# Patient Record
Sex: Male | Born: 1975 | Race: Black or African American | Hispanic: No | Marital: Single | Smoking: Current every day smoker
Health system: Southern US, Community
[De-identification: ages and names within clinical notes are randomized; demographics above are authoritative.]

## PROBLEM LIST (undated history)

## (undated) DIAGNOSIS — S82209A Unspecified fracture of shaft of unspecified tibia, initial encounter for closed fracture: Secondary | ICD-10-CM

## (undated) DIAGNOSIS — S82409A Unspecified fracture of shaft of unspecified fibula, initial encounter for closed fracture: Secondary | ICD-10-CM

## (undated) DIAGNOSIS — IMO0002 Reserved for concepts with insufficient information to code with codable children: Secondary | ICD-10-CM

## (undated) HISTORY — DX: Unspecified fracture of shaft of unspecified fibula, initial encounter for closed fracture: S82.409A

## (undated) HISTORY — PX: PERCUTANEOUS FIXATION TIBIAL SHAFT FRACTURE W/ PINS / SCREWS: SUR1009

## (undated) HISTORY — DX: Unspecified fracture of shaft of unspecified tibia, initial encounter for closed fracture: S82.209A

---

## 2011-06-28 ENCOUNTER — Inpatient Hospital Stay (INDEPENDENT_AMBULATORY_CARE_PROVIDER_SITE_OTHER)
Admission: RE | Admit: 2011-06-28 | Discharge: 2011-06-28 | Disposition: A | Discharge status: Home / Self Care | Payer: Self-pay | Source: Ambulatory Visit | Attending: Emergency Medicine | Admitting: Emergency Medicine

## 2011-06-28 ENCOUNTER — Emergency Department (HOSPITAL_COMMUNITY)
Admission: EM | Admit: 2011-06-28 | Discharge: 2011-06-28 | Disposition: A | Discharge status: Home / Self Care | Payer: Self-pay | Attending: Emergency Medicine | Admitting: Emergency Medicine

## 2011-06-28 ENCOUNTER — Emergency Department (HOSPITAL_COMMUNITY): Payer: Self-pay

## 2011-06-28 DIAGNOSIS — F1010 Alcohol abuse, uncomplicated: Secondary | ICD-10-CM | POA: Insufficient documentation

## 2011-06-28 DIAGNOSIS — S060X9A Concussion with loss of consciousness of unspecified duration, initial encounter: Secondary | ICD-10-CM

## 2011-06-28 DIAGNOSIS — S0100XA Unspecified open wound of scalp, initial encounter: Secondary | ICD-10-CM

## 2011-06-28 DIAGNOSIS — R4182 Altered mental status, unspecified: Secondary | ICD-10-CM | POA: Insufficient documentation

## 2011-06-28 DIAGNOSIS — S0990XA Unspecified injury of head, initial encounter: Secondary | ICD-10-CM

## 2011-06-28 DIAGNOSIS — W1789XA Other fall from one level to another, initial encounter: Secondary | ICD-10-CM | POA: Insufficient documentation

## 2011-06-28 DIAGNOSIS — S06330A Contusion and laceration of cerebrum, unspecified, without loss of consciousness, initial encounter: Secondary | ICD-10-CM | POA: Insufficient documentation

## 2011-06-28 LAB — CBC
HCT: 37.5 % — ABNORMAL LOW (ref 39.0–52.0)
Hemoglobin: 12.8 g/dL — ABNORMAL LOW (ref 13.0–17.0)
MCHC: 34.1 g/dL (ref 30.0–36.0)
RBC: 4.19 MIL/uL — ABNORMAL LOW (ref 4.22–5.81)
WBC: 12.6 10*3/uL — ABNORMAL HIGH (ref 4.0–10.5)

## 2011-06-28 LAB — TYPE AND SCREEN: Antibody Screen: NEGATIVE

## 2011-06-28 LAB — COMPREHENSIVE METABOLIC PANEL
ALT: 13 U/L (ref 0–53)
AST: 39 U/L — ABNORMAL HIGH (ref 0–37)
Albumin: 4 g/dL (ref 3.5–5.2)
Alkaline Phosphatase: 45 U/L (ref 39–117)
Chloride: 105 mEq/L (ref 96–112)
Potassium: 3.8 mEq/L (ref 3.5–5.1)
Sodium: 144 mEq/L (ref 135–145)
Total Bilirubin: 0.4 mg/dL (ref 0.3–1.2)
Total Protein: 8.1 g/dL (ref 6.0–8.3)

## 2011-06-28 LAB — DIFFERENTIAL
Basophils Absolute: 0 10*3/uL (ref 0.0–0.1)
Lymphocytes Relative: 19 % (ref 12–46)
Monocytes Absolute: 0.9 10*3/uL (ref 0.1–1.0)
Neutro Abs: 9.3 10*3/uL — ABNORMAL HIGH (ref 1.7–7.7)
Neutrophils Relative %: 74 % (ref 43–77)

## 2011-06-28 LAB — PROTIME-INR: INR: 1.03 (ref 0.00–1.49)

## 2012-06-14 ENCOUNTER — Encounter (HOSPITAL_COMMUNITY): Payer: Self-pay

## 2012-06-14 ENCOUNTER — Emergency Department (HOSPITAL_COMMUNITY)
Admission: EM | Admit: 2012-06-14 | Discharge: 2012-06-14 | Disposition: A | Discharge status: Home / Self Care | Payer: Self-pay | Attending: Emergency Medicine | Admitting: Emergency Medicine

## 2012-06-14 ENCOUNTER — Ambulatory Visit (HOSPITAL_COMMUNITY)
Admission: RE | Admit: 2012-06-14 | Discharge: 2012-06-14 | Disposition: A | Discharge status: Home / Self Care | Payer: Self-pay | Source: Ambulatory Visit | Attending: Emergency Medicine | Admitting: Emergency Medicine

## 2012-06-14 ENCOUNTER — Other Ambulatory Visit (HOSPITAL_COMMUNITY): Payer: Self-pay | Admitting: Emergency Medicine

## 2012-06-14 DIAGNOSIS — J029 Acute pharyngitis, unspecified: Secondary | ICD-10-CM | POA: Insufficient documentation

## 2012-06-14 DIAGNOSIS — M542 Cervicalgia: Secondary | ICD-10-CM | POA: Insufficient documentation

## 2012-06-14 DIAGNOSIS — R509 Fever, unspecified: Secondary | ICD-10-CM | POA: Insufficient documentation

## 2012-06-14 DIAGNOSIS — R220 Localized swelling, mass and lump, head: Secondary | ICD-10-CM | POA: Insufficient documentation

## 2012-06-14 DIAGNOSIS — J039 Acute tonsillitis, unspecified: Secondary | ICD-10-CM | POA: Insufficient documentation

## 2012-06-14 DIAGNOSIS — R070 Pain in throat: Secondary | ICD-10-CM | POA: Insufficient documentation

## 2012-06-14 DIAGNOSIS — R599 Enlarged lymph nodes, unspecified: Secondary | ICD-10-CM | POA: Insufficient documentation

## 2012-06-14 DIAGNOSIS — R1310 Dysphagia, unspecified: Secondary | ICD-10-CM | POA: Insufficient documentation

## 2012-06-14 DIAGNOSIS — R221 Localized swelling, mass and lump, neck: Secondary | ICD-10-CM | POA: Insufficient documentation

## 2012-06-14 MED ORDER — ACETAMINOPHEN 160 MG/5ML PO SOLN
ORAL | Status: AC
  Filled 2012-06-14: qty 40.6

## 2012-06-14 MED ORDER — MORPHINE SULFATE 4 MG/ML IJ SOLN
INTRAMUSCULAR | Status: AC
  Filled 2012-06-14: qty 1

## 2012-06-14 MED ORDER — IOHEXOL 300 MG/ML  SOLN
100.0000 mL | Freq: Once | INTRAMUSCULAR | Status: AC | PRN
  Administered 2012-06-14: 100 mL via INTRAVENOUS

## 2012-06-14 NOTE — ED Notes (Signed)
See downtime charting. 

## 2013-08-16 ENCOUNTER — Encounter (HOSPITAL_COMMUNITY): Payer: Self-pay | Admitting: Nurse Practitioner

## 2013-08-16 ENCOUNTER — Emergency Department (HOSPITAL_COMMUNITY)
Admission: EM | Admit: 2013-08-16 | Discharge: 2013-08-16 | Discharge status: Against Medical Advice | Payer: Self-pay | Attending: Emergency Medicine | Admitting: Emergency Medicine

## 2013-08-16 DIAGNOSIS — F17200 Nicotine dependence, unspecified, uncomplicated: Secondary | ICD-10-CM | POA: Insufficient documentation

## 2013-08-16 DIAGNOSIS — R296 Repeated falls: Secondary | ICD-10-CM | POA: Insufficient documentation

## 2013-08-16 DIAGNOSIS — IMO0002 Reserved for concepts with insufficient information to code with codable children: Secondary | ICD-10-CM | POA: Insufficient documentation

## 2013-08-16 DIAGNOSIS — S298XXA Other specified injuries of thorax, initial encounter: Secondary | ICD-10-CM | POA: Insufficient documentation

## 2013-08-16 DIAGNOSIS — R780 Finding of alcohol in blood: Secondary | ICD-10-CM

## 2013-08-16 DIAGNOSIS — R4789 Other speech disturbances: Secondary | ICD-10-CM | POA: Insufficient documentation

## 2013-08-16 DIAGNOSIS — Y929 Unspecified place or not applicable: Secondary | ICD-10-CM | POA: Insufficient documentation

## 2013-08-16 DIAGNOSIS — Y9389 Activity, other specified: Secondary | ICD-10-CM | POA: Insufficient documentation

## 2013-08-16 DIAGNOSIS — R079 Chest pain, unspecified: Secondary | ICD-10-CM

## 2013-08-16 DIAGNOSIS — F1010 Alcohol abuse, uncomplicated: Secondary | ICD-10-CM | POA: Insufficient documentation

## 2013-08-16 DIAGNOSIS — Z880 Allergy status to penicillin: Secondary | ICD-10-CM | POA: Insufficient documentation

## 2013-08-16 HISTORY — DX: Reserved for concepts with insufficient information to code with codable children: IMO0002

## 2013-08-16 LAB — URINALYSIS, ROUTINE W REFLEX MICROSCOPIC
Bilirubin Urine: NEGATIVE
Glucose, UA: NEGATIVE mg/dL
Hgb urine dipstick: NEGATIVE
Protein, ur: NEGATIVE mg/dL
Urobilinogen, UA: 0.2 mg/dL (ref 0.0–1.0)

## 2013-08-16 LAB — RAPID URINE DRUG SCREEN, HOSP PERFORMED
Amphetamines: NOT DETECTED
Cocaine: NOT DETECTED
Opiates: NOT DETECTED
Tetrahydrocannabinol: NOT DETECTED

## 2013-08-16 LAB — CBC WITH DIFFERENTIAL/PLATELET
Basophils Absolute: 0 10*3/uL (ref 0.0–0.1)
Basophils Relative: 0 % (ref 0–1)
Eosinophils Absolute: 0.1 10*3/uL (ref 0.0–0.7)
Hemoglobin: 14 g/dL (ref 13.0–17.0)
MCH: 30.8 pg (ref 26.0–34.0)
MCHC: 34.7 g/dL (ref 30.0–36.0)
Neutro Abs: 5.8 10*3/uL (ref 1.7–7.7)
Neutrophils Relative %: 64 % (ref 43–77)
Platelets: 260 10*3/uL (ref 150–400)

## 2013-08-16 LAB — COMPREHENSIVE METABOLIC PANEL
ALT: 30 U/L (ref 0–53)
AST: 57 U/L — ABNORMAL HIGH (ref 0–37)
CO2: 24 mEq/L (ref 19–32)
Calcium: 9.7 mg/dL (ref 8.4–10.5)
Chloride: 102 mEq/L (ref 96–112)
Creatinine, Ser: 0.95 mg/dL (ref 0.50–1.35)
GFR calc Af Amer: >90 mL/min (ref >90)
GFR calc non Af Amer: >90 mL/min (ref >90)
Glucose, Bld: 85 mg/dL (ref 70–99)
Sodium: 142 mEq/L (ref 135–145)
Total Bilirubin: 0.3 mg/dL (ref 0.3–1.2)

## 2013-08-16 LAB — ETHANOL: Alcohol, Ethyl (B): 260 mg/dL — ABNORMAL HIGH (ref 0–11)

## 2013-08-16 LAB — POCT I-STAT TROPONIN I: Troponin i, poc: 0 ng/mL (ref 0.00–0.08)

## 2013-08-16 MED ORDER — LORAZEPAM 2 MG/ML IJ SOLN: 1.0000 mg | Freq: Once | INTRAMUSCULAR | Status: DC
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
## 2013-08-16 NOTE — ED Notes (Signed)
Pt refusing blood draw and/or IV.

## 2013-08-16 NOTE — ED Notes (Signed)
Entered patient's room and he was pulling off all of his wires and getting dressed. When I asked him what he was doing he stated he was leaving. Notified physician and tending RN. Patient signed AMA and [employer] and I escorted patient out front where he was picked up by someone. Patient was stable and sustained no injury upon departure.
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
## 2013-08-18 NOTE — ED Provider Notes (Signed)
Medical screening examination/treatment/procedure(s) were performed by non-physician practitioner and as supervising physician I was immediately available for consultation/collaboration.  Jasyn Mey R. Shakiyla Kook, MD 08/18/13 1613 

## 2015-11-25 ENCOUNTER — Emergency Department (HOSPITAL_COMMUNITY): Payer: Self-pay

## 2015-11-25 ENCOUNTER — Inpatient Hospital Stay (HOSPITAL_COMMUNITY)
Admission: EM | Admit: 2015-11-25 | Discharge: 2015-11-28 | DRG: 493 | Disposition: A | Discharge status: Home / Self Care | Payer: Self-pay | Attending: Orthopedic Surgery | Admitting: Orthopedic Surgery

## 2015-11-25 ENCOUNTER — Emergency Department (HOSPITAL_COMMUNITY): Payer: Self-pay | Admitting: Anesthesiology

## 2015-11-25 ENCOUNTER — Encounter (HOSPITAL_COMMUNITY): Payer: Self-pay | Admitting: Emergency Medicine

## 2015-11-25 ENCOUNTER — Encounter (HOSPITAL_COMMUNITY)
Admission: EM | Disposition: A | Discharge status: Home / Self Care | Payer: Self-pay | Source: Home / Self Care | Attending: Orthopedic Surgery

## 2015-11-25 DIAGNOSIS — S82832A Other fracture of upper and lower end of left fibula, initial encounter for closed fracture: Secondary | ICD-10-CM

## 2015-11-25 DIAGNOSIS — S82202B Unspecified fracture of shaft of left tibia, initial encounter for open fracture type I or II: Principal | ICD-10-CM | POA: Diagnosis present

## 2015-11-25 DIAGNOSIS — T360X5A Adverse effect of penicillins, initial encounter: Secondary | ICD-10-CM | POA: Diagnosis present

## 2015-11-25 DIAGNOSIS — S82409A Unspecified fracture of shaft of unspecified fibula, initial encounter for closed fracture: Secondary | ICD-10-CM

## 2015-11-25 DIAGNOSIS — M85862 Other specified disorders of bone density and structure, left lower leg: Secondary | ICD-10-CM | POA: Diagnosis present

## 2015-11-25 DIAGNOSIS — Z9889 Other specified postprocedural states: Secondary | ICD-10-CM

## 2015-11-25 DIAGNOSIS — F17210 Nicotine dependence, cigarettes, uncomplicated: Secondary | ICD-10-CM | POA: Diagnosis present

## 2015-11-25 DIAGNOSIS — T886XXA Anaphylactic reaction due to adverse effect of correct drug or medicament properly administered, initial encounter: Secondary | ICD-10-CM | POA: Diagnosis present

## 2015-11-25 DIAGNOSIS — Z8781 Personal history of (healed) traumatic fracture: Secondary | ICD-10-CM

## 2015-11-25 DIAGNOSIS — S82209A Unspecified fracture of shaft of unspecified tibia, initial encounter for closed fracture: Secondary | ICD-10-CM | POA: Diagnosis present

## 2015-11-25 DIAGNOSIS — Z880 Allergy status to penicillin: Secondary | ICD-10-CM

## 2015-11-25 DIAGNOSIS — S82402B Unspecified fracture of shaft of left fibula, initial encounter for open fracture type I or II: Secondary | ICD-10-CM | POA: Diagnosis present

## 2015-11-25 HISTORY — PX: TIBIA IM NAIL INSERTION: SHX2516

## 2015-11-25 LAB — CBC WITH DIFFERENTIAL/PLATELET
Basophils Absolute: 0 10*3/uL (ref 0.0–0.1)
Basophils Relative: 0 %
EOS PCT: 1 %
Eosinophils Absolute: 0.1 10*3/uL (ref 0.0–0.7)
HEMATOCRIT: 38.5 % — AB (ref 39.0–52.0)
HEMOGLOBIN: 12.3 g/dL — AB (ref 13.0–17.0)
Lymphocytes Relative: 28 %
Lymphs Abs: 3 10*3/uL (ref 0.7–4.0)
MCH: 29.6 pg (ref 26.0–34.0)
MCHC: 31.9 g/dL (ref 30.0–36.0)
MCV: 92.8 fL (ref 78.0–100.0)
MONO ABS: 0.2 10*3/uL (ref 0.1–1.0)
Monocytes Relative: 1 %
Neutro Abs: 7.4 10*3/uL (ref 1.7–7.7)
Neutrophils Relative %: 70 %
Platelets: 249 10*3/uL (ref 150–400)
RBC: 4.15 MIL/uL — ABNORMAL LOW (ref 4.22–5.81)
RDW: 12.6 % (ref 11.5–15.5)
WBC: 10.6 10*3/uL — AB (ref 4.0–10.5)

## 2015-11-25 LAB — URINALYSIS, ROUTINE W REFLEX MICROSCOPIC
Bilirubin Urine: NEGATIVE
GLUCOSE, UA: NEGATIVE mg/dL
HGB URINE DIPSTICK: NEGATIVE
Ketones, ur: NEGATIVE mg/dL
Leukocytes, UA: NEGATIVE
Nitrite: NEGATIVE
PH: 5 (ref 5.0–8.0)
Protein, ur: NEGATIVE mg/dL
SPECIFIC GRAVITY, URINE: 1.008 (ref 1.005–1.030)

## 2015-11-25 LAB — RAPID URINE DRUG SCREEN, HOSP PERFORMED
AMPHETAMINES: NOT DETECTED
Barbiturates: NOT DETECTED
Benzodiazepines: NOT DETECTED
Cocaine: NOT DETECTED
Opiates: NOT DETECTED
TETRAHYDROCANNABINOL: NOT DETECTED

## 2015-11-25 LAB — ETHANOL: Alcohol, Ethyl (B): 349 mg/dL (ref <5)

## 2015-11-25 SURGERY — INSERTION, INTRAMEDULLARY ROD, TIBIA
Anesthesia: General | Site: Leg Lower | Laterality: Left

## 2015-11-25 MED ORDER — SUCCINYLCHOLINE CHLORIDE 20 MG/ML IJ SOLN
INTRAMUSCULAR | Status: DC | PRN
  Administered 2015-11-25: 120 mg via INTRAVENOUS

## 2015-11-25 MED ORDER — TETANUS-DIPHTH-ACELL PERTUSSIS 5-2.5-18.5 LF-MCG/0.5 IM SUSP: 0.5000 mL | Freq: Once | INTRAMUSCULAR | Status: DC

## 2015-11-25 MED ORDER — ROCURONIUM BROMIDE 50 MG/5ML IV SOLN
INTRAVENOUS | Status: AC
  Filled 2015-11-25: qty 1

## 2015-11-25 MED ORDER — ONDANSETRON HCL 4 MG/2ML IJ SOLN
INTRAMUSCULAR | Status: DC | PRN
  Administered 2015-11-25: 4 mg via INTRAVENOUS

## 2015-11-25 MED ORDER — ROCURONIUM BROMIDE 100 MG/10ML IV SOLN
INTRAVENOUS | Status: DC | PRN
  Administered 2015-11-25: 30 mg via INTRAVENOUS

## 2015-11-25 MED ORDER — LIDOCAINE HCL (CARDIAC) 20 MG/ML IV SOLN
INTRAVENOUS | Status: AC
Start: 2015-11-25 — End: 2015-11-25
  Filled 2015-11-25: qty 5

## 2015-11-25 MED ORDER — PROMETHAZINE HCL 25 MG/ML IJ SOLN: 6.2500 mg | INTRAMUSCULAR | Status: DC | PRN

## 2015-11-25 MED ORDER — FENTANYL CITRATE (PF) 100 MCG/2ML IJ SOLN
50.0000 ug | INTRAMUSCULAR | Status: DC | PRN
  Administered 2015-11-25: 50 ug via INTRAVENOUS
  Filled 2015-11-25: qty 2

## 2015-11-25 MED ORDER — PROPOFOL 10 MG/ML IV BOLUS
INTRAVENOUS | Status: AC
  Filled 2015-11-25: qty 40

## 2015-11-25 MED ORDER — ONDANSETRON HCL 4 MG/2ML IJ SOLN
INTRAMUSCULAR | Status: AC
  Filled 2015-11-25: qty 2

## 2015-11-25 MED ORDER — SUCCINYLCHOLINE CHLORIDE 20 MG/ML IJ SOLN
INTRAMUSCULAR | Status: AC
Start: 2015-11-25 — End: 2015-11-25
  Filled 2015-11-25: qty 1

## 2015-11-25 MED ORDER — 0.9 % SODIUM CHLORIDE (POUR BTL) OPTIME
TOPICAL | Status: DC | PRN
  Administered 2015-11-25: 1000 mL
  Administered 2015-11-25: 2000 mL

## 2015-11-25 MED ORDER — GLYCOPYRROLATE 0.2 MG/ML IJ SOLN
INTRAMUSCULAR | Status: DC | PRN
  Administered 2015-11-25: 0.4 mg via INTRAVENOUS

## 2015-11-25 MED ORDER — GLYCOPYRROLATE 0.2 MG/ML IJ SOLN
INTRAMUSCULAR | Status: AC
  Filled 2015-11-25: qty 2

## 2015-11-25 MED ORDER — CLINDAMYCIN PHOSPHATE 900 MG/50ML IV SOLN
900.0000 mg | Freq: Once | INTRAVENOUS | Status: AC
  Administered 2015-11-25: 900 mg via INTRAVENOUS
  Filled 2015-11-25: qty 50

## 2015-11-25 MED ORDER — HYDROMORPHONE HCL 1 MG/ML IJ SOLN
0.2500 mg | INTRAMUSCULAR | Status: DC | PRN
  Administered 2015-11-26 (×2): 0.5 mg via INTRAVENOUS

## 2015-11-25 MED ORDER — MIDAZOLAM HCL 2 MG/2ML IJ SOLN
INTRAMUSCULAR | Status: AC
  Filled 2015-11-25: qty 2

## 2015-11-25 MED ORDER — LIDOCAINE HCL (CARDIAC) 20 MG/ML IV SOLN
INTRAVENOUS | Status: DC | PRN
  Administered 2015-11-25: 80 mg via INTRAVENOUS

## 2015-11-25 MED ORDER — SODIUM CHLORIDE 0.9 % IV BOLUS (SEPSIS)
1000.0000 mL | Freq: Once | INTRAVENOUS | Status: AC
  Administered 2015-11-25: 1000 mL via INTRAVENOUS

## 2015-11-25 MED ORDER — LACTATED RINGERS IV SOLN
INTRAVENOUS | Status: DC | PRN
  Administered 2015-11-25 (×3): via INTRAVENOUS

## 2015-11-25 MED ORDER — FENTANYL CITRATE (PF) 250 MCG/5ML IJ SOLN
INTRAMUSCULAR | Status: AC
  Filled 2015-11-25: qty 5

## 2015-11-25 MED ORDER — NEOSTIGMINE METHYLSULFATE 10 MG/10ML IV SOLN
INTRAVENOUS | Status: DC | PRN
  Administered 2015-11-25: 4 mg via INTRAVENOUS

## 2015-11-25 MED ORDER — MIDAZOLAM HCL 5 MG/5ML IJ SOLN
INTRAMUSCULAR | Status: DC | PRN
  Administered 2015-11-25: 2 mg via INTRAVENOUS

## 2015-11-25 MED ORDER — FENTANYL CITRATE (PF) 100 MCG/2ML IJ SOLN
INTRAMUSCULAR | Status: DC | PRN
  Administered 2015-11-25: 100 ug via INTRAVENOUS
  Administered 2015-11-25: 50 ug via INTRAVENOUS
  Administered 2015-11-25: 100 ug via INTRAVENOUS

## 2015-11-25 MED ORDER — NEOSTIGMINE METHYLSULFATE 10 MG/10ML IV SOLN
INTRAVENOUS | Status: AC
  Filled 2015-11-25: qty 1

## 2015-11-25 MED ORDER — PROPOFOL 10 MG/ML IV BOLUS
INTRAVENOUS | Status: DC | PRN
  Administered 2015-11-25: 150 mg via INTRAVENOUS

## 2015-11-25 SURGICAL SUPPLY — 71 items
BANDAGE ACE 4X5 VEL STRL LF (GAUZE/BANDAGES/DRESSINGS) ×3 IMPLANT
BANDAGE ACE 6X5 VEL STRL LF (GAUZE/BANDAGES/DRESSINGS) ×3 IMPLANT
BANDAGE ELASTIC 4 VELCRO ST LF (GAUZE/BANDAGES/DRESSINGS) ×3 IMPLANT
BANDAGE ELASTIC 6 VELCRO ST LF (GAUZE/BANDAGES/DRESSINGS) ×3 IMPLANT
BANDAGE ESMARK 6X9 LF (GAUZE/BANDAGES/DRESSINGS) IMPLANT
BIT DRILL 3.8X6 NS (BIT) ×3 IMPLANT
BIT DRILL 4.4 NS (BIT) ×3 IMPLANT
BLADE SURG 10 STRL SS (BLADE) ×3 IMPLANT
BNDG COHESIVE 4X5 TAN STRL (GAUZE/BANDAGES/DRESSINGS) ×3 IMPLANT
BNDG ESMARK 6X9 LF (GAUZE/BANDAGES/DRESSINGS)
BNDG GAUZE ELAST 4 BULKY (GAUZE/BANDAGES/DRESSINGS) ×3 IMPLANT
COVER SURGICAL LIGHT HANDLE (MISCELLANEOUS) ×3 IMPLANT
CUFF TOURNIQUET SINGLE 34IN LL (TOURNIQUET CUFF) ×3 IMPLANT
CUFF TOURNIQUET SINGLE 44IN (TOURNIQUET CUFF) IMPLANT
DRAPE C-ARM 42X72 X-RAY (DRAPES) ×3 IMPLANT
DRAPE C-ARMOR (DRAPES) ×3 IMPLANT
DRAPE IMP U-DRAPE 54X76 (DRAPES) ×3 IMPLANT
DRAPE INCISE IOBAN 66X45 STRL (DRAPES) ×3 IMPLANT
DRAPE ORTHO SPLIT 77X108 STRL (DRAPES)
DRAPE SURG ORHT 6 SPLT 77X108 (DRAPES) IMPLANT
DRAPE U-SHAPE 47X51 STRL (DRAPES) ×3 IMPLANT
DRSG ADAPTIC 3X8 NADH LF (GAUZE/BANDAGES/DRESSINGS) ×3 IMPLANT
ELECT REM PT RETURN 9FT ADLT (ELECTROSURGICAL) ×3
ELECTRODE REM PT RTRN 9FT ADLT (ELECTROSURGICAL) ×1 IMPLANT
EVACUATOR 1/8 PVC DRAIN (DRAIN) IMPLANT
FACESHIELD STD STERILE (MASK) ×3 IMPLANT
FLUID NSS /IRRIG 1000 ML XXX (MISCELLANEOUS) ×6 IMPLANT
GAUZE SPONGE 4X4 12PLY STRL (GAUZE/BANDAGES/DRESSINGS) ×3 IMPLANT
GLOVE BIO SURGEON STRL SZ7.5 (GLOVE) ×6 IMPLANT
GLOVE BIOGEL PI IND STRL 6.5 (GLOVE) ×1 IMPLANT
GLOVE BIOGEL PI IND STRL 7.5 (GLOVE) ×1 IMPLANT
GLOVE BIOGEL PI INDICATOR 6.5 (GLOVE) ×2
GLOVE BIOGEL PI INDICATOR 7.5 (GLOVE) ×2
GLOVE SKINSENSE NS SZ8.0 LF (GLOVE) ×4
GLOVE SKINSENSE STRL SZ8.0 LF (GLOVE) ×2 IMPLANT
GLOVE SURG ORTHO 8.0 STRL STRW (GLOVE) IMPLANT
GOWN STRL REUS W/ TWL LRG LVL3 (GOWN DISPOSABLE) ×3 IMPLANT
GOWN STRL REUS W/TWL LRG LVL3 (GOWN DISPOSABLE) ×6
GUIDEWIRE BALL NOSE 80CM (WIRE) ×3 IMPLANT
HANDPIECE INTERPULSE COAX TIP (DISPOSABLE) ×2
KIT BASIN OR (CUSTOM PROCEDURE TRAY) ×3 IMPLANT
KIT ROOM TURNOVER OR (KITS) ×3 IMPLANT
NAIL TIBIA 12X36.0 (Nail) ×3 IMPLANT
PACK ORTHO EXTREMITY (CUSTOM PROCEDURE TRAY) ×3 IMPLANT
PACK UNIVERSAL I (CUSTOM PROCEDURE TRAY) ×3 IMPLANT
PAD ARMBOARD 7.5X6 YLW CONV (MISCELLANEOUS) ×6 IMPLANT
PIN GUIDE 3.2 903003004 (MISCELLANEOUS) ×3 IMPLANT
SCREW ACECAP 40MM (Screw) ×3 IMPLANT
SCREW ACECAP 42MM (Screw) ×3 IMPLANT
SCREW CORTICAL 5.5 35MM (Screw) ×3 IMPLANT
SCREW PROXIMAL DEPUY (Screw) ×4 IMPLANT
SCREW PRXML FT 40X5.5XNS LF (Screw) ×1 IMPLANT
SCREW PRXML FT 50X5.5XLCK NS (Screw) ×1 IMPLANT
SET HNDPC FAN SPRY TIP SCT (DISPOSABLE) ×1 IMPLANT
SPONGE GAUZE 4X4 12PLY STER LF (GAUZE/BANDAGES/DRESSINGS) ×3 IMPLANT
SPONGE LAP 18X18 X RAY DECT (DISPOSABLE) ×3 IMPLANT
STAPLER VISISTAT 35W (STAPLE) IMPLANT
STOCKINETTE TUBULAR 6 INCH (GAUZE/BANDAGES/DRESSINGS) ×3 IMPLANT
SUT ETHILON 2 0 FS 18 (SUTURE) ×3 IMPLANT
SUT PROLENE 3 0 PS 2 (SUTURE) IMPLANT
SUT VIC AB 0 CT1 27 (SUTURE) ×2
SUT VIC AB 0 CT1 27XBRD ANBCTR (SUTURE) ×1 IMPLANT
SUT VIC AB 1 CT1 27 (SUTURE) ×2
SUT VIC AB 1 CT1 27XBRD ANBCTR (SUTURE) ×1 IMPLANT
SUT VIC AB 2-0 CT1 27 (SUTURE) ×2
SUT VIC AB 2-0 CT1 TAPERPNT 27 (SUTURE) ×1 IMPLANT
TOWEL OR 17X24 6PK STRL BLUE (TOWEL DISPOSABLE) ×6 IMPLANT
TOWEL OR 17X26 10 PK STRL BLUE (TOWEL DISPOSABLE) ×6 IMPLANT
TUBE CONNECTING 12'X1/4 (SUCTIONS) ×1
TUBE CONNECTING 12X1/4 (SUCTIONS) ×2 IMPLANT
YANKAUER SUCT BULB TIP NO VENT (SUCTIONS) ×3 IMPLANT

## 2015-11-25 NOTE — Anesthesia Preprocedure Evaluation (Addendum)
Anesthesia Evaluation  Patient identified by MRN, date of birth, ID band Patient awake    Reviewed: Allergy & Precautions, NPO status , Patient's Chart, lab work & pertinent test results  History of Anesthesia Complications Negative for: history of anesthetic complications  Airway Mallampati: II   Neck ROM: Full    Dental   Pulmonary Current Smoker,    breath sounds clear to auscultation       Cardiovascular negative cardio ROS   Rhythm:Regular Rate:Normal     Neuro/Psych    GI/Hepatic negative GI ROS, (+)     substance abuse  alcohol use,   Endo/Other    Renal/GU negative Renal ROS     Musculoskeletal   Abdominal   Peds  Hematology   Anesthesia Other Findings   Reproductive/Obstetrics                            Anesthesia Physical Anesthesia Plan  ASA: II and emergent  Anesthesia Plan: General   Post-op Pain Management:    Induction: Intravenous  Airway Management Planned: Oral ETT  Additional Equipment:   Intra-op Plan:   Post-operative Plan:   Informed Consent: I have reviewed the patients History and Physical, chart, labs and discussed the procedure including the risks, benefits and alternatives for the proposed anesthesia with the patient or authorized representative who has indicated his/her understanding and acceptance.   Dental advisory given  Plan Discussed with: CRNA and Surgeon  Anesthesia Plan Comments:         Anesthesia Quick Evaluation
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
## 2015-11-25 NOTE — ED Notes (Signed)
MD at bedside. 
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
## 2015-11-25 NOTE — ED Notes (Signed)
Attempted to apply pressure dressing, pt yellling at RN to stop

## 2015-11-25 NOTE — ED Notes (Signed)
Pt escorted to short stay 36, [place]
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
## 2015-11-25 NOTE — Brief Op Note (Signed)
11/25/2015  9:30 PM  PATIENT:  Martin BelfastHenry Hisle Jr.  40 y.o. male  PRE-OPERATIVE DIAGNOSIS:  1.  Grade I open left Tibia/Fibula Fracture  POST-OPERATIVE DIAGNOSIS:  1.  Grade I open left Tibia/Fibula Fracture  PROCEDURE:  Procedure(s): 1. ORIF left tibia fracture with IM nail - Biome tibia Versa nail 2.  Incisional and non-incisional debridement left leg wound, sharply of skin, subcutaneous tissue and bone  SURGEON:  Surgeon(s) and Role:    * Durene RomansMatthew Ravyn Nikkel, MD - Primary  PHYSICIAN ASSISTANT: None  ASSISTANTS: Surgical team   ANESTHESIA:   general  EBL:  Total I/O In: -  Out: 1000 [Urine:1000]  BLOOD ADMINISTERED:none  DRAINS: none   LOCAL MEDICATIONS USED:  NONE  SPECIMEN:  No Specimen  DISPOSITION OF SPECIMEN:  N/A  COUNTS:  YES  TOURNIQUET:  _____ min @ 250 mmHg  DICTATION: .Other Dictation: Dictation Number [id number]  PLAN OF CARE: Admit to inpatient   PATIENT DISPOSITION:  PACU - hemodynamically stable.   Delay start of Pharmacological VTE agent (>24hrs) due to surgical blood loss or risk of bleeding: no

## 2015-11-25 NOTE — Anesthesia Procedure Notes (Signed)
Procedure Name: Intubation Date/Time: 11/25/2015 10:04 PM Performed by: Devron Cohick S Pre-anesthesia Checklist: Patient identified, Timeout performed, Emergency Drugs available, Suction available and Patient being monitored Patient Re-evaluated:Patient Re-evaluated prior to inductionOxygen Delivery Method: Circle system utilized Preoxygenation: Pre-oxygenation with 100% oxygen Intubation Type: IV induction, Rapid sequence and Cricoid Pressure applied Ventilation: Mask ventilation without difficulty Laryngoscope Size: Mac and 4 Grade View: Grade I Tube type: Oral Tube size: 8.0 mm Number of attempts: 1 Airway Equipment and Method: Stylet Placement Confirmation: ETT inserted through vocal cords under direct vision,  positive ETCO2 and breath sounds checked- equal and bilateral Secured at: 22 cm Tube secured with: Tape Dental Injury: Teeth and Oropharynx as per pre-operative assessment

## 2015-11-25 NOTE — ED Notes (Signed)
Ortho MD at bedside.

## 2015-11-25 NOTE — ED Notes (Signed)
[employer] at bedside to file report

## 2015-11-25 NOTE — ED Notes (Signed)
Keys, cell phone, credit card, clothing all given to sister Maryann AlarSamantha Dacquisto. Pair of cut underwear underneath patient. No other belongings in ED.

## 2015-11-26 DIAGNOSIS — S82409A Unspecified fracture of shaft of unspecified fibula, initial encounter for closed fracture: Secondary | ICD-10-CM

## 2015-11-26 DIAGNOSIS — S82209A Unspecified fracture of shaft of unspecified tibia, initial encounter for closed fracture: Secondary | ICD-10-CM | POA: Diagnosis present

## 2015-11-26 HISTORY — DX: Unspecified fracture of shaft of unspecified tibia, initial encounter for closed fracture: S82.209A

## 2015-11-26 HISTORY — DX: Unspecified fracture of shaft of unspecified fibula, initial encounter for closed fracture: S82.409A

## 2015-11-26 MED ORDER — NICOTINE 14 MG/24HR TD PT24
14.0000 mg | MEDICATED_PATCH | Freq: Every day | TRANSDERMAL | Status: DC
  Administered 2015-11-26 – 2015-11-27 (×2): 14 mg via TRANSDERMAL
  Filled 2015-11-26 (×3): qty 1

## 2015-11-26 MED ORDER — METHOCARBAMOL 500 MG PO TABS
500.0000 mg | ORAL_TABLET | Freq: Four times a day (QID) | ORAL | Status: DC | PRN
  Administered 2015-11-26 – 2015-11-27 (×5): 500 mg via ORAL
  Filled 2015-11-26 (×5): qty 1

## 2015-11-26 MED ORDER — DOCUSATE SODIUM 100 MG PO CAPS
100.0000 mg | ORAL_CAPSULE | Freq: Two times a day (BID) | ORAL | Status: DC
  Administered 2015-11-26 – 2015-11-28 (×6): 100 mg via ORAL
  Filled 2015-11-26 (×6): qty 1

## 2015-11-26 MED ORDER — METHOCARBAMOL 1000 MG/10ML IJ SOLN: 500.0000 mg | Freq: Four times a day (QID) | INTRAVENOUS | Status: DC | PRN

## 2015-11-26 MED ORDER — METOCLOPRAMIDE HCL 5 MG PO TABS: 5.0000 mg | ORAL_TABLET | Freq: Three times a day (TID) | ORAL | Status: DC | PRN

## 2015-11-26 MED ORDER — HYDROCODONE-ACETAMINOPHEN 7.5-325 MG PO TABS
1.0000 | ORAL_TABLET | ORAL | Status: DC | PRN
  Administered 2015-11-26 – 2015-11-28 (×8): 2 via ORAL
  Filled 2015-11-26 (×9): qty 2

## 2015-11-26 MED ORDER — METOCLOPRAMIDE HCL 5 MG/ML IJ SOLN: 5.0000 mg | Freq: Three times a day (TID) | INTRAMUSCULAR | Status: DC | PRN

## 2015-11-26 MED ORDER — HYDROMORPHONE HCL 1 MG/ML IJ SOLN
0.5000 mg | INTRAMUSCULAR | Status: DC | PRN
  Administered 2015-11-26 (×2): 2 mg via INTRAVENOUS
  Administered 2015-11-26 (×2): 1 mg via INTRAVENOUS
  Administered 2015-11-27 – 2015-11-28 (×3): 2 mg via INTRAVENOUS
  Filled 2015-11-26 (×3): qty 2
  Filled 2015-11-26: qty 1
  Filled 2015-11-26 (×2): qty 2
  Filled 2015-11-26: qty 1

## 2015-11-26 MED ORDER — ONDANSETRON HCL 4 MG PO TABS: 4.0000 mg | ORAL_TABLET | Freq: Four times a day (QID) | ORAL | Status: DC | PRN

## 2015-11-26 MED ORDER — ASPIRIN EC 325 MG PO TBEC
325.0000 mg | DELAYED_RELEASE_TABLET | Freq: Two times a day (BID) | ORAL | Status: DC
  Administered 2015-11-26 – 2015-11-28 (×6): 325 mg via ORAL
  Filled 2015-11-26 (×6): qty 1

## 2015-11-26 MED ORDER — ACETAMINOPHEN 650 MG RE SUPP: 650.0000 mg | Freq: Four times a day (QID) | RECTAL | Status: DC | PRN

## 2015-11-26 MED ORDER — DIPHENHYDRAMINE HCL 12.5 MG/5ML PO ELIX
12.5000 mg | ORAL_SOLUTION | ORAL | Status: DC | PRN
  Administered 2015-11-27: 12.5 mg via ORAL
  Filled 2015-11-26: qty 10

## 2015-11-26 MED ORDER — SPIRITUS FRUMENTI
1.0000 | Freq: Every day | ORAL | Status: DC
  Administered 2015-11-26: 1 via ORAL
  Filled 2015-11-26 (×5): qty 1

## 2015-11-26 MED ORDER — HYDROMORPHONE HCL 1 MG/ML IJ SOLN
INTRAMUSCULAR | Status: AC
  Administered 2015-11-26: 0.5 mg via INTRAVENOUS
  Filled 2015-11-26: qty 2

## 2015-11-26 MED ORDER — ACETAMINOPHEN 325 MG PO TABS: 650.0000 mg | ORAL_TABLET | Freq: Four times a day (QID) | ORAL | Status: DC | PRN

## 2015-11-26 MED ORDER — POLYETHYLENE GLYCOL 3350 17 G PO PACK: 17.0000 g | PACK | Freq: Every day | ORAL | Status: DC | PRN

## 2015-11-26 MED ORDER — SODIUM CHLORIDE 0.9 % IV SOLN
INTRAVENOUS | Status: DC
  Administered 2015-11-26 – 2015-11-27 (×3): via INTRAVENOUS

## 2015-11-26 MED ORDER — ONDANSETRON HCL 4 MG/2ML IJ SOLN: 4.0000 mg | Freq: Four times a day (QID) | INTRAMUSCULAR | Status: DC | PRN

## 2015-11-26 MED ORDER — CLINDAMYCIN PHOSPHATE 600 MG/50ML IV SOLN
600.0000 mg | Freq: Four times a day (QID) | INTRAVENOUS | Status: AC
  Administered 2015-11-26 (×3): 600 mg via INTRAVENOUS
  Filled 2015-11-26 (×3): qty 50

## 2015-11-26 NOTE — Progress Notes (Addendum)
   Subjective:  Patient reports pain as mild to moderate.  No c/o.  Objective:   VITALS:   Filed Vitals:   11/26/15 0200 11/26/15 0300 11/26/15 0415 11/26/15 0532  BP: 115/63 110/61 110/70 114/69  Pulse: 94 93 86 94  Temp: 98.5 F (36.9 C) 98.7 F (37.1 C) 99.2 F (37.3 C) 98.2 F (36.8 C)  TempSrc: Oral Oral Oral Oral  Resp: 18 20 18 16   Height:      SpO2: 95% 98% 98% 98%    Sensation intact distally Intact pulses distally Dorsiflexion/Plantar flexion intact Incision: moderate drainage Compartment soft Dressing changed at bedside, wounds benign No pain with passive stretch  Lab Results  Component Value Date   WBC 10.6* 11/25/2015   HGB 12.3* 11/25/2015   HCT 38.5* 11/25/2015   MCV 92.8 11/25/2015   PLT 249 11/25/2015   BMET    Component Value Date/Time   NA 142 08/16/2013 2145   K 4.2 08/16/2013 2145   CL 102 08/16/2013 2145   CO2 24 08/16/2013 2145   GLUCOSE 85 08/16/2013 2145   BUN 9 08/16/2013 2145   CREATININE 0.95 08/16/2013 2145   CALCIUM 9.7 08/16/2013 2145   GFRNONAA >90 08/16/2013 2145   GFRAA >90 08/16/2013 2145     Assessment/Plan: 1 Day Post-Op   Active Problems:   Tibia/fibula fracture   Advance diet Up with therapy IV abx for open fx ASA for DVT ppx Compartment checks D/C planning   Deanndra Kirley, Cloyde ReamsBrian James 11/26/2015, 8:50 AM   Samson FredericBrian Cheryel Kyte, MD Cell [phone]

## 2015-11-26 NOTE — Progress Notes (Signed)
Ring given to sister Lelon MastSamantha
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
## 2015-11-26 NOTE — Transfer of Care (Signed)
Immediate Anesthesia Transfer of Care Note  Patient: Martin BelfastHenry Stgermaine Jr.  Procedure(s) Performed: Procedure(s): INTRAMEDULLARY (IM) NAIL TIBIAL LEFT (Left)  Patient Location: PACU  Anesthesia Type:General  Level of Consciousness: awake, alert  and oriented  Airway & Oxygen Therapy: Patient Spontanous Breathing and Patient connected to face mask oxygen  Post-op Assessment: Report given to RN and Post -op Vital signs reviewed and stable  Post vital signs: Reviewed and stable  Last Vitals:  Filed Vitals:   11/25/15 2030 11/25/15 2045  BP: 130/74 127/70  Pulse: 92 96  Temp:    Resp:      Complications: No apparent anesthesia complications

## 2015-11-26 NOTE — Progress Notes (Signed)
Utilization review completed.  

## 2015-11-26 NOTE — Progress Notes (Signed)
PATIENT ARRIVED TO  UNIT 5N FROM PACU VIA BED. VITALS OBTAINED AND ASSESSMENT COMPLETED.  PATIENT DROWSY, BUT ORIENTED TO UNIT AND EQUIPMENT.  SISTER AT BEDSIDE.  INSTRUCTED TO CALL FOR ASSISTANCE WHEN NEEDED.

## 2015-11-27 ENCOUNTER — Encounter (HOSPITAL_COMMUNITY): Payer: Self-pay | Admitting: Orthopedic Surgery

## 2015-11-27 MED ORDER — ASPIRIN 325 MG PO TBEC: 325.0000 mg | DELAYED_RELEASE_TABLET | Freq: Two times a day (BID) | ORAL | Status: AC

## 2015-11-27 MED ORDER — DOXYCYCLINE HYCLATE 50 MG PO CAPS
100.0000 mg | ORAL_CAPSULE | Freq: Two times a day (BID) | ORAL | Status: AC
Start: 2015-11-27 — End: 2015-12-03

## 2015-11-27 MED ORDER — METHOCARBAMOL 500 MG PO TABS: 500.0000 mg | ORAL_TABLET | Freq: Four times a day (QID) | ORAL | Status: AC | PRN

## 2015-11-27 MED ORDER — POLYETHYLENE GLYCOL 3350 17 G PO PACK: 17.0000 g | PACK | Freq: Every day | ORAL | Status: DC | PRN

## 2015-11-27 MED ORDER — DOCUSATE SODIUM 100 MG PO CAPS: 100.0000 mg | ORAL_CAPSULE | Freq: Two times a day (BID) | ORAL | Status: AC

## 2015-11-27 MED ORDER — HYDROCODONE-ACETAMINOPHEN 7.5-325 MG PO TABS: 1.0000 | ORAL_TABLET | Freq: Four times a day (QID) | ORAL | Status: DC | PRN
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
## 2015-11-27 NOTE — Progress Notes (Signed)
Patient ID: Martin Neal., male   DOB: [birth year], 40 y.o.   MRN: [id number] Subjective: 2 Days Post-Op Procedure(s) (LRB): INTRAMEDULLARY (IM) NAIL TIBIAL LEFT (Left)    Patient reports pain as mild to moderate depending on activity and position of leg. Recognizes swelling and stiffness involving the left lower extremity No events reported  Objective:   VITALS:   Filed Vitals:   11/27/15 0508 11/27/15 1324  BP: 118/71 109/73  Pulse: 77 86  Temp: 98.5 F (36.9 C) 98.5 F (36.9 C)  Resp: 16 18    Neurovascular intact Incision: dressing C/D/I   Removed surgical dressing today.  No signs of infection at open site  LABS  Recent Labs  11/25/15 2015  HGB 12.3*  HCT 38.5*  WBC 10.6*  PLT 249    No results for input(s): NA, K, BUN, CREATININE, GLUCOSE in the last 72 hours.  No results for input(s): LABPT, INR in the last 72 hours.   Assessment/Plan: 2 Days Post-Op Procedure(s) (LRB): INTRAMEDULLARY (IM) NAIL TIBIAL LEFT (Left)   Plan for discharge tomorrow  Rx on chart including pain meds and antibiotics

## 2015-11-27 NOTE — Discharge Instructions (Signed)
Fibular Fracture With Rehab °The fibula is the smaller of the two lower leg bones and is vulnerable to breaks (fracture). Fibular fractures may go fully through the bone (complete) or partially (incomplete). The bone fragments are rarely out of alignment (displaced fracture). Fibula fractures may occur anywhere along the bone. However, this document only discusses fractures that do not involve a leg joint. Fibular fractures are not often a severe injury because the bone supports only about 17% of the body weight. °SYMPTOMS  °· Moderate to severe pain in the lower leg. °· Tenderness and swelling in the leg or calf. °· Bleeding and/or bruising (contusion) in the leg. °· Inability to bear weight on the injured extremity. °· Visible deformity, if the fracture is displaced. °· Numbness and coldness in the leg and foot, beyond the fracture site, if blood supply is impaired. °CAUSES  °Fractures occur when a force is placed on the bone that is greater than it can withstand. Common causes of fibular fracture include: °· Direct hit (trauma) (i.e., hockey or lacrosse check to the lower leg). °· Stress fracture (weakening of the bone from repeated stress). °· Indirect injury, caused by twisting, turning quickly, or violent muscle contraction. °RISK INCREASES WITH: °· Contact sports (i.e., football, soccer, lacrosse, hockey). °· Sports that can cause twisted ankle injury (i.e., skiing, basketball). °· Bony abnormalities (i.e., osteoporosis or bone tumors). °· Metabolism disorders, hormone problems, and nutrition deficiency and disorders (i.e., anorexia and bulimia). °· Poor strength and flexibility. °PREVENTION  °· Warm up and stretch properly before activity. °· Maintain physical fitness: °¨ Strength, flexibility, and endurance. °¨ Cardiovascular fitness. °· Wear properly fitted and padded protective equipment (i.e., shin guards for soccer). °PROGNOSIS  °If treated properly, fibular fractures usually heal in 4 to 6 weeks.    °RELATED COMPLICATIONS  °· Failure of bone to heal (nonunion). °· Bone heals in a poor position (malunion). °· Increased pressure inside the leg (compartment syndrome) due to injury that disrupts the blood supply to the leg and foot and injures the nerves and muscles (uncommon). °· Shortening of the injured bones. °· Hindrance of normal bone growth in children. °· Risks of surgery: infection, bleeding, injury to nerves (numbness, weakness, paralysis), need for further surgery. °· Longer healing time if activity is resumed too soon. °TREATMENT °Treatment first involves ice, medicine, and elevation of the leg to reduce pain and inflammation. People with fibular fractures are advised to walk using crutches. A brace or walking boot may be given to restrain the injured leg and allow for healing. Sometimes, surgery is needed to place a rod, plate, or screws in the bones in order to fix the fracture. After surgery, the leg is restrained. After restraint (with or without surgery), it is important to complete strengthening and stretching exercises to regain strength and a full range of motion. Exercises may be completed at home or with a therapist. °MEDICATION  °· If pain medicine is needed, nonsteroidal anti-inflammatory medicines (aspirin and ibuprofen), or other minor pain relievers (acetaminophen), are often advised. °· Do not take pain medicine for 7 days before surgery. °· Prescription pain relievers may be given if your health care provider thinks they are needed. Use only as directed and only as much as you need. °SEEK MEDICAL CARE IF: °· Symptoms get worse or do not improve in 2 weeks, despite treatment. °· The following occur after restraint or surgery. (Report any of these signs immediately): °¨ Swelling above or below the fracture site. °¨ Severe, persistent pain. °¨   Blue or gray skin below the fracture site, especially under the toenails. Numbness or loss of feeling below the fracture site. °¨ New, unexplained  symptoms develop. (Drugs used in treatment may produce side effects.) °EXERCISES  °RANGE OF MOTION (ROM) AND STRETCHING EXERCISES - Fibular Fracture °These exercises may help you when beginning to recover from your injury. Your symptoms may go away with or without further involvement from your physician, physical therapist or athletic trainer. While completing these exercises, remember:  °· Restoring tissue flexibility helps normal motion to return to the joints. This allows healthier, less painful movement and activity. °· An effective stretch should be held for at least 30 seconds. °· A stretch should never be painful. You should only feel a gentle lengthening or release in the stretched tissue. °RANGE OF MOTION - Dorsi/Plantar Flexion °· While sitting with your right / left knee straight, draw the top of your foot upwards by flexing your ankle. Then reverse the motion, pointing your toes downward. °· Hold each position for __________ seconds. °· After completing your first set of exercises, repeat this exercise with your knee bent. °Repeat __________ times. Complete this exercise __________ times per day.  °STRETCH - Gastrocsoleus  °· Sit with your right / left leg extended. Holding onto both ends of a belt or towel, loop it around the ball of your foot. °· Keeping your right / left ankle and foot relaxed and your knee straight, pull your foot and ankle toward you using the belt. °· You should feel a gentle stretch behind your calf or knee. Hold this position for __________ seconds. °Repeat __________ times. Complete this stretch __________ times per day.  °RANGE OF MOTION- Ankle Plantar Flexion  °· Sit with your right / left leg crossed over your opposite knee. °· Use your opposite hand to pull the top of your foot and toes toward you. °· You should feel a gentle stretch on the top of your foot and ankle. Hold this position for __________ seconds. °Repeat __________ times. Complete __________ times per day.    °RANGE OF MOTION - Ankle Eversion °· Sit with your right / left ankle crossed over your opposite knee. °· Grip your foot with your opposite hand, placing your thumb on the top of your foot and your fingers across the bottom of your foot. °· Gently push your foot downward with a slight rotation so your littlest toes rise slightly toward the ceiling. °· You should feel a gentle stretch on the inside of your ankle. Hold the stretch for __________ seconds. °Repeat __________ times. Complete this exercise __________ times per day.  °RANGE OF MOTION - Ankle Inversion °· Sit with your right / left ankle crossed over your opposite knee. °· Grip your foot with your opposite hand, placing your thumb on the bottom of your foot and your fingers across the top of your foot. °· Gently pull your foot so the smallest toe comes toward you and your thumb pushes the inside of the ball of your foot away from you. °· You should feel a gentle stretch on the outside of your ankle. Hold the stretch for __________ seconds. °Repeat __________ times. Complete this exercise __________ times per day.  °RANGE OF MOTION - Ankle Alphabet °· Imagine your right / left big toe is a pen. °· Keeping your hip and knee still, write out the entire alphabet with your "pen." Make the letters as large as you can, without increasing any discomfort. °Repeat __________ times. Complete this exercise   __________ times per day.  °RANGE OF MOTION - Ankle Dorsiflexion, Active Assisted °· Remove your shoes and sit on a chair, preferably not on a carpeted surface. °· Place your right / left foot on the floor, directly under your knee. Extend your opposite leg for support. °· Keeping your heel down, slide your right / left foot back toward the chair, until you feel a stretch at your ankle or calf. If you do not feel a stretch, slide your bottom forward to the edge of the chair, while still keeping your heel down. °· Hold this stretch for __________ seconds. °Repeat  __________ times. Complete this stretch __________ times per day.  °STRENGTHENING EXERCISES - Fibular Fracture °These exercises may help you when beginning to recover from your injury. They may resolve your symptoms with or without further involvement from your physician, physical therapist or athletic trainer. While completing these exercises, remember:  °· Muscles can gain both the endurance and the strength needed for everyday activities through controlled exercises. °· Complete these exercises as instructed by your physician, physical therapist or athletic trainer. Increase the resistance and repetitions only as guided. °· You may experience muscle soreness or fatigue, but the pain or discomfort you are trying to eliminate should never worsen during these exercises. If this pain does get worse, stop and make certain you are following the directions exactly. If the pain is still present after adjustments, discontinue the exercise until you can discuss the trouble with your clinician. °STRENGTH - Dorsiflexors °· Secure a rubber exercise band or tubing to a fixed object (table, pole) and loop the other end around your right / left foot. °· Sit on the floor, facing the fixed object. The band should be slightly tense when your foot is relaxed. °· Slowly draw your foot back toward you, using your ankle and toes. °· Hold this position for __________ seconds. Slowly release the tension in the band and return your foot to the starting position. °Repeat __________ times. Complete this exercise __________ times per day.  °STRENGTH - Plantar-flexors °· Sit with your right / left leg extended. Holding onto both ends of a rubber exercise band or tubing, loop it around the ball of your foot. Keep a slight tension in the band. °· Slowly push your toes away from you, pointing them downward. °· Hold this position for __________ seconds. Return to the starting position slowly, controlling the tension in the band. °Repeat  __________ times. Complete this exercise __________ times per day.  °STRENGTH - Plantar-flexors, Standing  °· Stand with your feet shoulder width apart. Place your hands on a wall or table to steady yourself, using as little support as needed. °· Keeping your weight evenly spread over the width of your feet, rise up on your toes.* °· Hold this position for __________ seconds. °Repeat __________ times. Complete this exercise __________ times per day.  °*If this is too easy, shift your weight toward your right / left leg until you feel challenged. Ultimately, you may be asked to do this exercise while standing on your right / left foot only. °STRENGTH - Towel Curls °· Sit in a chair, on a non-carpeted surface. °· Place your foot on a towel, keeping your heel on the floor. °· Pull the towel toward your heel only by curling your toes. Keep your heel on the floor. °· If instructed by your physician, physical therapist or athletic trainer, add ____________________ at the end of the towel. °Repeat __________ times. Complete this exercise __________   times per day. °STRENGTH - Ankle Eversion °· Secure one end of a rubber exercise band or tubing to a fixed object (table, pole). Loop the other end around your foot, just before your toes. °· Place your fists between your knees. This will focus your strengthening at your ankle. °· Drawing the band across your opposite foot, away from the pole, slowly pull your little toe out and up. Make sure the band is positioned to resist the entire motion. °· Hold this position for __________ seconds. °· Return to the starting position slowly, controlling the tension in the band. °Repeat __________ times. Complete this exercise __________ times per day.  °STRENGTH - Ankle Inversion °· Secure one end of a rubber exercise band or tubing to a fixed object (table, pole). Loop the other end around your foot, just before your toes. °· Place your fists between your knees. This will focus your  strengthening at your ankle. °· Slowly, pull your big toe up and in, making sure the band is positioned to resist the entire motion. °· Hold this position for __________ seconds. °· Return to the starting position slowly, controlling the tension in the band. °Repeat __________ times. Complete this exercises __________ times per day.  °  °This information is not intended to replace advice given to you by your health care provider. Make sure you discuss any questions you have with your health care provider. °  °Document Released: 11/10/2005 Document Revised: 12/01/2014 Document Reviewed: 02/22/2009 °Elsevier Interactive Patient Education ©2016 [employer] ° °
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
## 2015-11-28 NOTE — Progress Notes (Signed)
Pt is being discharged home. Discharge instructions were given to patient and family. Discharge instructions included but not limited to  Sign and symptoms of infection, when to call the doctor and follow up appoitment were discussed with the patient. 
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
## 2015-12-11 NOTE — Anesthesia Postprocedure Evaluation (Signed)
Anesthesia Post Note  Patient: Martin Neal.  Procedure(s) Performed: Procedure(s) (LRB): INTRAMEDULLARY (IM) NAIL TIBIAL LEFT (Left)  Patient location during evaluation: PACU Anesthesia Type: General Level of consciousness: awake and alert Pain management: pain level controlled Vital Signs Assessment: post-procedure vital signs reviewed and stable Respiratory status: spontaneous breathing, nonlabored ventilation, respiratory function stable and patient connected to nasal cannula oxygen Cardiovascular status: blood pressure returned to baseline and stable Postop Assessment: no signs of nausea or vomiting Anesthetic complications: no    Last Vitals:  Filed Vitals:   11/27/15 2111 11/28/15 0533  BP: 126/83 124/69  Pulse: 85 83  Temp: 37.2 C 36.9 C  Resp: 18 16    Last Pain:  Filed Vitals:   11/28/15 1259  PainSc: 4                  Masayoshi Couzens,JAMES TERRILL

## 2017-02-14 ENCOUNTER — Emergency Department (HOSPITAL_COMMUNITY): Payer: Self-pay

## 2017-02-14 ENCOUNTER — Emergency Department (HOSPITAL_COMMUNITY)
Admission: EM | Admit: 2017-02-14 | Discharge: 2017-02-14 | Disposition: A | Discharge status: Home / Self Care | Payer: Self-pay | Attending: Emergency Medicine | Admitting: Emergency Medicine

## 2017-02-14 DIAGNOSIS — Z79899 Other long term (current) drug therapy: Secondary | ICD-10-CM | POA: Insufficient documentation

## 2017-02-14 DIAGNOSIS — F17210 Nicotine dependence, cigarettes, uncomplicated: Secondary | ICD-10-CM | POA: Insufficient documentation

## 2017-02-14 DIAGNOSIS — F10920 Alcohol use, unspecified with intoxication, uncomplicated: Secondary | ICD-10-CM

## 2017-02-14 DIAGNOSIS — R9389 Abnormal findings on diagnostic imaging of other specified body structures: Secondary | ICD-10-CM

## 2017-02-14 DIAGNOSIS — F1910 Other psychoactive substance abuse, uncomplicated: Secondary | ICD-10-CM

## 2017-02-14 DIAGNOSIS — G9389 Other specified disorders of brain: Secondary | ICD-10-CM | POA: Insufficient documentation

## 2017-02-14 LAB — RAPID URINE DRUG SCREEN, HOSP PERFORMED
Amphetamines: NOT DETECTED
BARBITURATES: NOT DETECTED
BENZODIAZEPINES: NOT DETECTED
COCAINE: NOT DETECTED
OPIATES: NOT DETECTED
TETRAHYDROCANNABINOL: NOT DETECTED

## 2017-02-14 LAB — I-STAT CHEM 8, ED
BUN: 19 mg/dL (ref 6–20)
CHLORIDE: 112 mmol/L — AB (ref 101–111)
CREATININE: 1.4 mg/dL — AB (ref 0.61–1.24)
Calcium, Ion: 1 mmol/L — ABNORMAL LOW (ref 1.15–1.40)
GLUCOSE: 87 mg/dL (ref 65–99)
HEMATOCRIT: 37 % — AB (ref 39.0–52.0)
HEMOGLOBIN: 12.6 g/dL — AB (ref 13.0–17.0)
POTASSIUM: 3.4 mmol/L — AB (ref 3.5–5.1)
Sodium: 145 mmol/L (ref 135–145)
TCO2: 23 mmol/L (ref 0–100)

## 2017-02-14 LAB — CBC WITH DIFFERENTIAL/PLATELET
BASOS ABS: 0 10*3/uL (ref 0.0–0.1)
Basophils Relative: 0 %
EOS ABS: 0.1 10*3/uL (ref 0.0–0.7)
Eosinophils Relative: 1 %
HEMATOCRIT: 35 % — AB (ref 39.0–52.0)
HEMOGLOBIN: 11.5 g/dL — AB (ref 13.0–17.0)
LYMPHS ABS: 2.9 10*3/uL (ref 0.7–4.0)
LYMPHS PCT: 22 %
MCH: 28.7 pg (ref 26.0–34.0)
MCHC: 32.9 g/dL (ref 30.0–36.0)
MCV: 87.3 fL (ref 78.0–100.0)
MONOS PCT: 4 %
Monocytes Absolute: 0.6 10*3/uL (ref 0.1–1.0)
Neutro Abs: 9.5 10*3/uL — ABNORMAL HIGH (ref 1.7–7.7)
Neutrophils Relative %: 73 %
Platelets: 271 10*3/uL (ref 150–400)
RBC: 4.01 MIL/uL — AB (ref 4.22–5.81)
RDW: 12.5 % (ref 11.5–15.5)
WBC: 13.2 10*3/uL — ABNORMAL HIGH (ref 4.0–10.5)

## 2017-02-14 LAB — ETHANOL: ALCOHOL ETHYL (B): 219 mg/dL — AB (ref <5)

## 2017-02-14 LAB — I-STAT TROPONIN, ED: Troponin i, poc: 0 ng/mL (ref 0.00–0.08)

## 2017-02-14 LAB — CBG MONITORING, ED: Glucose-Capillary: 84 mg/dL (ref 65–99)

## 2017-02-14 LAB — ACETAMINOPHEN LEVEL: Acetaminophen (Tylenol), Serum: <10 ug/mL — ABNORMAL LOW (ref 10–30)

## 2017-02-14 LAB — SALICYLATE LEVEL: Salicylate Lvl: <7 mg/dL (ref 2.8–30.0)

## 2017-02-14 MED ORDER — AMMONIA AROMATIC IN INHA
RESPIRATORY_TRACT | Status: AC
  Filled 2017-02-14: qty 10

## 2017-02-14 MED ORDER — NALOXONE HCL 2 MG/2ML IJ SOSY
PREFILLED_SYRINGE | INTRAMUSCULAR | Status: AC
  Administered 2017-02-14: 04:00:00
  Filled 2017-02-14: qty 2

## 2017-02-14 MED ORDER — LORAZEPAM 2 MG/ML IJ SOLN: 1.0000 mg | Freq: Once | INTRAMUSCULAR | Status: DC
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
## 2017-02-14 NOTE — ED Notes (Signed)
[facility], charge nurse aware and [facility] called for transport
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
## 2017-02-14 NOTE — ED Notes (Signed)
[facility] for [facility]. Pt alert and ambulatory at this time.

## 2017-02-14 NOTE — ED Notes (Signed)
Pt back from MRI. A&Ox4.

## 2017-02-14 NOTE — ED Notes (Signed)
Bed: RESB Expected date:  Expected time:  Means of arrival:  Comments: From jail unresponsive, ETOH

## 2017-02-14 NOTE — ED Notes (Signed)
Ammonia inhalant given and patient woke up

## 2017-02-15 NOTE — ED Provider Notes (Signed)
3:22 PM Assumed care from Dr. Nicanor AlconPalumbo, please see their note for full history, physical and decision making until this point. In brief this is a 41 y.o. year old male who presented to the ED tonight with Alcohol Intoxication and Drug Problem     Patient with behavior however improves with pneumonia apparently. He is sent here for MRI secondary to his CT scan showed encephalomalacia. This was discussed with our on-call neurologist who felt that MRI was indicated. No acute findings patient can be discharged back to the custody of law enforcement. On my evaluation the patient has an odd demeanor but is moving all extremities is alert, appropriate and oriented. 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 No acute findings. Patient is stable to be discharged back to the care of the sheriff's office. He will follow up with neurology.   Discharge instructions, including strict return precautions for new or worsening symptoms, given. Patient and/or family verbalized understanding and agreement with the plan as described.   Labs, studies and imaging reviewed by myself and considered in medical decision making if ordered. Imaging interpreted by radiology.  Labs Reviewed  CBC WITH DIFFERENTIAL/PLATELET - Abnormal; Notable for the following:       Result Value   WBC 13.2 (*)    RBC 4.01 (*)    Hemoglobin 11.5 (*)    HCT 35.0 (*)    Neutro Abs 9.5 (*)    All other components within normal limits  ACETAMINOPHEN LEVEL - Abnormal; Notable for the following:    Acetaminophen (Tylenol), Serum <10 (*)    All other components within normal limits  ETHANOL - Abnormal; Notable for the following:    Alcohol, Ethyl (B) 219 (*)    All other components within normal limits  I-STAT CHEM 8, ED - Abnormal; Notable for the following:    Potassium 3.4 (*)    Chloride 112 (*)    Creatinine, Ser 1.40 (*)    Calcium, Ion 1.00 (*)    Hemoglobin 12.6 (*)    HCT 37.0 (*)    All other  components within normal limits  SALICYLATE LEVEL  RAPID URINE DRUG SCREEN, HOSP PERFORMED  CBG MONITORING, ED  I-STAT TROPOININ, ED    MR BRAIN WO CONTRAST  Final Result    CT Head Wo Contrast  Final Result      No Follow-up on file.    Marily MemosJason Jaymen Fetch, MD 02/15/17 [phone]

## 2018-04-27 ENCOUNTER — Encounter (HOSPITAL_COMMUNITY): Payer: Self-pay | Admitting: Emergency Medicine

## 2018-04-27 ENCOUNTER — Emergency Department (HOSPITAL_COMMUNITY)
Admission: EM | Admit: 2018-04-27 | Discharge: 2018-04-28 | Disposition: A | Discharge status: Home / Self Care | Payer: Self-pay | Attending: Emergency Medicine | Admitting: Emergency Medicine

## 2018-04-27 ENCOUNTER — Other Ambulatory Visit: Payer: Self-pay

## 2018-04-27 DIAGNOSIS — R55 Syncope and collapse: Secondary | ICD-10-CM | POA: Insufficient documentation

## 2018-04-27 DIAGNOSIS — F17210 Nicotine dependence, cigarettes, uncomplicated: Secondary | ICD-10-CM | POA: Insufficient documentation

## 2018-04-27 LAB — CBC WITH DIFFERENTIAL/PLATELET
BASOS ABS: 0 10*3/uL (ref 0.0–0.1)
Basophils Relative: 0 %
EOS PCT: 1 %
Eosinophils Absolute: 0.1 10*3/uL (ref 0.0–0.7)
HEMATOCRIT: 37.5 % — AB (ref 39.0–52.0)
Hemoglobin: 12.7 g/dL — ABNORMAL LOW (ref 13.0–17.0)
LYMPHS ABS: 3 10*3/uL (ref 0.7–4.0)
LYMPHS PCT: 32 %
MCH: 30.8 pg (ref 26.0–34.0)
MCHC: 33.9 g/dL (ref 30.0–36.0)
MCV: 90.8 fL (ref 78.0–100.0)
MONO ABS: 0.4 10*3/uL (ref 0.1–1.0)
Monocytes Relative: 5 %
NEUTROS ABS: 6 10*3/uL (ref 1.7–7.7)
Neutrophils Relative %: 62 %
PLATELETS: 253 10*3/uL (ref 150–400)
RBC: 4.13 MIL/uL — ABNORMAL LOW (ref 4.22–5.81)
RDW: 12.4 % (ref 11.5–15.5)
WBC: 9.6 10*3/uL (ref 4.0–10.5)

## 2018-04-27 LAB — BASIC METABOLIC PANEL
Anion gap: 11 (ref 5–15)
BUN: 15 mg/dL (ref 6–20)
CO2: 24 mmol/L (ref 22–32)
Calcium: 9.2 mg/dL (ref 8.9–10.3)
Chloride: 106 mmol/L (ref 101–111)
Creatinine, Ser: 1.04 mg/dL (ref 0.61–1.24)
GFR calc Af Amer: >60 mL/min (ref >60)
GLUCOSE: 84 mg/dL (ref 65–99)
Potassium: 3.6 mmol/L (ref 3.5–5.1)
Sodium: 141 mmol/L (ref 135–145)

## 2018-04-27 LAB — ETHANOL: Alcohol, Ethyl (B): 160 mg/dL — ABNORMAL HIGH (ref <10)

## 2018-04-27 LAB — TROPONIN I: Troponin I: <0.03 ng/mL (ref <0.03)

## 2018-04-27 MED ORDER — SODIUM CHLORIDE 0.9 % IV BOLUS
1000.0000 mL | Freq: Once | INTRAVENOUS | Status: AC
Start: 2018-04-27 — End: 2018-04-28
  Administered 2018-04-27: 1000 mL via INTRAVENOUS
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
## 2018-04-28 NOTE — Discharge Instructions (Signed)
Please read and follow all provided instructions.  Your diagnoses today include:  1. Syncope and collapse     Tests performed today include: An EKG of your heart Cardiac enzymes - a blood test for heart muscle damage Blood counts and electrolytes Vital signs. See below for your results today.   Medications prescribed:   Take any prescribed medications only as directed.  Follow-up instructions: Please follow-up with your primary care provider as soon as you can for further evaluation of your symptoms.   Return instructions:  SEEK IMMEDIATE MEDICAL ATTENTION IF: You have severe chest pain, especially if the pain is crushing or pressure-like and spreads to the arms, back, neck, or jaw, or if you have sweating, nausea (feeling sick to your stomach), or shortness of breath. THIS IS AN EMERGENCY. Don't wait to see if the pain will go away. Get medical help at once. Call 911 or 0 (operator). DO NOT drive yourself to the hospital.  Your chest pain gets worse and does not go away with rest.  You have an attack of chest pain lasting longer than usual, despite rest and treatment with the medications your caregiver has prescribed.  You wake from sleep with chest pain or shortness of breath. You feel dizzy or faint. You have chest pain not typical of your usual pain for which you originally saw your caregiver.  You have any other emergent concerns regarding your health.  Additional Information: Chest pain comes from many different causes. Your caregiver has diagnosed you as having chest pain that is not specific for one problem, but does not require admission.  You are at low risk for an acute heart condition or other serious illness.   Your vital signs today were: BP 111/83 (BP Location: Left Arm)    Pulse 96    Temp 98.9 F (37.2 C) (Oral)    Resp 16    Ht 6\' 1"  (1.854 m)    Wt 70.3 kg (155 lb)    SpO2 100%    BMI 20.45 kg/m  If your blood pressure (BP) was elevated above 135/85 this visit,  please have this repeated by your doctor within one month. --------------
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
## 2018-05-05 ENCOUNTER — Ambulatory Visit: Payer: Self-pay | Admitting: Cardiology

## 2018-05-06 ENCOUNTER — Encounter: Payer: Self-pay | Admitting: Cardiology

## 2018-12-24 ENCOUNTER — Ambulatory Visit (HOSPITAL_COMMUNITY): Admission: EM | Admit: 2018-12-24 | Discharge: 2018-12-24 | Discharge status: Against Medical Advice | Payer: Self-pay

## 2018-12-24 NOTE — ED Notes (Signed)
LWBS just after signing in

## 2019-01-25 ENCOUNTER — Ambulatory Visit: Payer: Self-pay | Admitting: Nurse Practitioner

## 2019-01-26 ENCOUNTER — Telehealth: Payer: Self-pay | Admitting: *Deleted

## 2019-01-26 NOTE — Telephone Encounter (Signed)
Patient no showed for their most recent appointment 01/25/2019. Please ask if there were any barriers causing the no show and offer the next available appointment.   

## 2019-06-22 ENCOUNTER — Encounter (HOSPITAL_COMMUNITY): Payer: Self-pay | Admitting: Emergency Medicine

## 2019-06-22 ENCOUNTER — Other Ambulatory Visit: Payer: Self-pay

## 2019-06-22 DIAGNOSIS — F17210 Nicotine dependence, cigarettes, uncomplicated: Secondary | ICD-10-CM | POA: Insufficient documentation

## 2019-06-22 DIAGNOSIS — Y999 Unspecified external cause status: Secondary | ICD-10-CM | POA: Insufficient documentation

## 2019-06-22 DIAGNOSIS — S0990XA Unspecified injury of head, initial encounter: Secondary | ICD-10-CM | POA: Insufficient documentation

## 2019-06-22 DIAGNOSIS — Y9289 Other specified places as the place of occurrence of the external cause: Secondary | ICD-10-CM | POA: Insufficient documentation

## 2019-06-22 DIAGNOSIS — S060X0A Concussion without loss of consciousness, initial encounter: Secondary | ICD-10-CM | POA: Insufficient documentation

## 2019-06-22 DIAGNOSIS — Y9389 Activity, other specified: Secondary | ICD-10-CM | POA: Insufficient documentation
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
## 2019-06-22 NOTE — ED Notes (Addendum)
Roll call for pt status, no answer. ENMiles ADDENDUM: Pt came back into ED waiting room 0015 approx from outside. Explained to pt name had been called earlier to check for status/update vitals, will be seen by MD once rooms available. Pt acknowledged/confirmed understanding. [employer]

## 2019-06-23 ENCOUNTER — Emergency Department (HOSPITAL_COMMUNITY)
Admission: EM | Admit: 2019-06-23 | Discharge: 2019-06-23 | Disposition: A | Discharge status: Home / Self Care | Payer: Self-pay | Attending: Emergency Medicine | Admitting: Emergency Medicine

## 2019-06-23 ENCOUNTER — Other Ambulatory Visit: Payer: Self-pay

## 2019-06-23 DIAGNOSIS — S060X0A Concussion without loss of consciousness, initial encounter: Secondary | ICD-10-CM

## 2019-06-23 DIAGNOSIS — S0990XA Unspecified injury of head, initial encounter: Secondary | ICD-10-CM
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
## 2020-05-24 ENCOUNTER — Emergency Department (HOSPITAL_COMMUNITY)
Admission: EM | Admit: 2020-05-24 | Discharge: 2020-05-24 | Discharge status: Against Medical Advice | Payer: Medicaid Other

## 2020-05-24 ENCOUNTER — Emergency Department (HOSPITAL_COMMUNITY)
Admission: EM | Admit: 2020-05-24 | Discharge: 2020-05-24 | Disposition: A | Discharge status: Home / Self Care | Payer: Medicaid Other | Attending: Emergency Medicine | Admitting: Emergency Medicine

## 2020-05-24 ENCOUNTER — Encounter (HOSPITAL_COMMUNITY): Payer: Self-pay | Admitting: Emergency Medicine

## 2020-05-24 ENCOUNTER — Encounter (HOSPITAL_COMMUNITY): Payer: Self-pay

## 2020-05-24 ENCOUNTER — Emergency Department (HOSPITAL_COMMUNITY)
Admission: EM | Admit: 2020-05-24 | Discharge: 2020-05-24 | Discharge status: Against Medical Advice | Payer: Medicaid Other | Attending: Emergency Medicine | Admitting: Emergency Medicine

## 2020-05-24 ENCOUNTER — Other Ambulatory Visit: Payer: Self-pay

## 2020-05-24 DIAGNOSIS — Z5321 Procedure and treatment not carried out due to patient leaving prior to being seen by health care provider: Secondary | ICD-10-CM | POA: Insufficient documentation

## 2020-05-24 DIAGNOSIS — K920 Hematemesis: Secondary | ICD-10-CM | POA: Insufficient documentation

## 2020-05-24 DIAGNOSIS — E860 Dehydration: Secondary | ICD-10-CM | POA: Insufficient documentation

## 2020-05-24 DIAGNOSIS — F17210 Nicotine dependence, cigarettes, uncomplicated: Secondary | ICD-10-CM | POA: Insufficient documentation

## 2020-05-24 DIAGNOSIS — R112 Nausea with vomiting, unspecified: Secondary | ICD-10-CM

## 2020-05-24 DIAGNOSIS — F10920 Alcohol use, unspecified with intoxication, uncomplicated: Secondary | ICD-10-CM

## 2020-05-24 DIAGNOSIS — K2921 Alcoholic gastritis with bleeding: Secondary | ICD-10-CM | POA: Insufficient documentation

## 2020-05-24 DIAGNOSIS — Z7982 Long term (current) use of aspirin: Secondary | ICD-10-CM | POA: Insufficient documentation

## 2020-05-24 DIAGNOSIS — F1010 Alcohol abuse, uncomplicated: Secondary | ICD-10-CM

## 2020-05-24 DIAGNOSIS — F10129 Alcohol abuse with intoxication, unspecified: Secondary | ICD-10-CM | POA: Insufficient documentation

## 2020-05-24 DIAGNOSIS — R05 Cough: Secondary | ICD-10-CM | POA: Insufficient documentation

## 2020-05-24 DIAGNOSIS — Y909 Presence of alcohol in blood, level not specified: Secondary | ICD-10-CM | POA: Insufficient documentation

## 2020-05-24 DIAGNOSIS — R42 Dizziness and giddiness: Secondary | ICD-10-CM | POA: Insufficient documentation

## 2020-05-24 LAB — LIPASE, BLOOD
Lipase: 42 U/L (ref 11–51)
Lipase: 42 U/L (ref 11–51)

## 2020-05-24 LAB — COMPREHENSIVE METABOLIC PANEL
ALT: 37 U/L (ref 0–44)
ALT: 35 U/L (ref 0–44)
AST: 79 U/L — ABNORMAL HIGH (ref 15–41)
AST: 70 U/L — ABNORMAL HIGH (ref 15–41)
Albumin: 4.4 g/dL (ref 3.5–5.0)
Albumin: 4.2 g/dL (ref 3.5–5.0)
Alkaline Phosphatase: 42 U/L (ref 38–126)
Alkaline Phosphatase: 40 U/L (ref 38–126)
Anion gap: 16 — ABNORMAL HIGH (ref 5–15)
Anion gap: 13 (ref 5–15)
BUN: 8 mg/dL (ref 6–20)
BUN: 7 mg/dL (ref 6–20)
CO2: 24 mmol/L (ref 22–32)
CO2: 26 mmol/L (ref 22–32)
Calcium: 8.8 mg/dL — ABNORMAL LOW (ref 8.9–10.3)
Calcium: 8.9 mg/dL (ref 8.9–10.3)
Chloride: 101 mmol/L (ref 98–111)
Chloride: 103 mmol/L (ref 98–111)
Creatinine, Ser: 0.93 mg/dL (ref 0.61–1.24)
Creatinine, Ser: 1.04 mg/dL (ref 0.61–1.24)
GFR calc Af Amer: >60 mL/min (ref >60)
GFR calc Af Amer: >60 mL/min (ref >60)
GFR calc non Af Amer: >60 mL/min (ref >60)
GFR calc non Af Amer: >60 mL/min (ref >60)
Glucose, Bld: 93 mg/dL (ref 70–99)
Glucose, Bld: 83 mg/dL (ref 70–99)
Potassium: 4.1 mmol/L (ref 3.5–5.1)
Potassium: 4.3 mmol/L (ref 3.5–5.1)
Sodium: 141 mmol/L (ref 135–145)
Sodium: 142 mmol/L (ref 135–145)
Total Bilirubin: 0.7 mg/dL (ref 0.3–1.2)
Total Bilirubin: 0.6 mg/dL (ref 0.3–1.2)
Total Protein: 8.7 g/dL — ABNORMAL HIGH (ref 6.5–8.1)
Total Protein: 8.3 g/dL — ABNORMAL HIGH (ref 6.5–8.1)

## 2020-05-24 LAB — CBC WITH DIFFERENTIAL/PLATELET
Abs Immature Granulocytes: 0.02 10*3/uL (ref 0.00–0.07)
Abs Immature Granulocytes: 0.01 10*3/uL (ref 0.00–0.07)
Basophils Absolute: 0 10*3/uL (ref 0.0–0.1)
Basophils Absolute: 0 10*3/uL (ref 0.0–0.1)
Basophils Relative: 0 %
Basophils Relative: 0 %
Eosinophils Absolute: 0.1 10*3/uL (ref 0.0–0.5)
Eosinophils Absolute: 0.1 10*3/uL (ref 0.0–0.5)
Eosinophils Relative: 1 %
Eosinophils Relative: 1 %
HCT: 38.3 % — ABNORMAL LOW (ref 39.0–52.0)
HCT: 36.7 % — ABNORMAL LOW (ref 39.0–52.0)
Hemoglobin: 12.5 g/dL — ABNORMAL LOW (ref 13.0–17.0)
Hemoglobin: 11.8 g/dL — ABNORMAL LOW (ref 13.0–17.0)
Immature Granulocytes: 0 %
Immature Granulocytes: 0 %
Lymphocytes Relative: 28 %
Lymphocytes Relative: 39 %
Lymphs Abs: 2 10*3/uL (ref 0.7–4.0)
Lymphs Abs: 2.3 10*3/uL (ref 0.7–4.0)
MCH: 30.2 pg (ref 26.0–34.0)
MCH: 30 pg (ref 26.0–34.0)
MCHC: 32.6 g/dL (ref 30.0–36.0)
MCHC: 32.2 g/dL (ref 30.0–36.0)
MCV: 92.5 fL (ref 80.0–100.0)
MCV: 93.4 fL (ref 80.0–100.0)
Monocytes Absolute: 0.3 10*3/uL (ref 0.1–1.0)
Monocytes Absolute: 0.5 10*3/uL (ref 0.1–1.0)
Monocytes Relative: 5 %
Monocytes Relative: 9 %
Neutro Abs: 4.9 10*3/uL (ref 1.7–7.7)
Neutro Abs: 2.9 10*3/uL (ref 1.7–7.7)
Neutrophils Relative %: 66 %
Neutrophils Relative %: 51 %
Platelets: 269 10*3/uL (ref 150–400)
Platelets: 256 10*3/uL (ref 150–400)
RBC: 4.14 MIL/uL — ABNORMAL LOW (ref 4.22–5.81)
RBC: 3.93 MIL/uL — ABNORMAL LOW (ref 4.22–5.81)
RDW: 12.8 % (ref 11.5–15.5)
RDW: 12.8 % (ref 11.5–15.5)
WBC: 7.4 10*3/uL (ref 4.0–10.5)
WBC: 5.8 10*3/uL (ref 4.0–10.5)
nRBC: 0 % (ref 0.0–0.2)
nRBC: 0 % (ref 0.0–0.2)

## 2020-05-24 LAB — URINALYSIS, ROUTINE W REFLEX MICROSCOPIC
Bilirubin Urine: NEGATIVE
Glucose, UA: NEGATIVE mg/dL
Ketones, ur: NEGATIVE mg/dL
Leukocytes,Ua: NEGATIVE
Nitrite: NEGATIVE
Protein, ur: NEGATIVE mg/dL
Specific Gravity, Urine: 1.003 — ABNORMAL LOW (ref 1.005–1.030)
pH: 5 (ref 5.0–8.0)

## 2020-05-24 LAB — ETHANOL: Alcohol, Ethyl (B): 263 mg/dL — ABNORMAL HIGH (ref <10)

## 2020-05-24 MED ORDER — ONDANSETRON HCL 4 MG/2ML IJ SOLN
4.0000 mg | Freq: Once | INTRAMUSCULAR | Status: AC
  Administered 2020-05-24: 4 mg via INTRAVENOUS
  Filled 2020-05-24: qty 2

## 2020-05-24 MED ORDER — CHLORDIAZEPOXIDE HCL 25 MG PO CAPS: ORAL_CAPSULE | ORAL | 0 refills | Status: AC

## 2020-05-24 MED ORDER — CHLORDIAZEPOXIDE HCL 25 MG PO CAPS: ORAL_CAPSULE | ORAL | 0 refills | Status: DC

## 2020-05-24 MED ORDER — ONDANSETRON HCL 4 MG PO TABS: 4.0000 mg | ORAL_TABLET | Freq: Three times a day (TID) | ORAL | 0 refills | Status: DC | PRN

## 2020-05-24 MED ORDER — SODIUM CHLORIDE 0.9 % IV BOLUS
1000.0000 mL | Freq: Once | INTRAVENOUS | Status: AC
  Administered 2020-05-24: 1000 mL via INTRAVENOUS

## 2020-05-24 MED ORDER — LORAZEPAM 1 MG PO TABS: ORAL_TABLET | ORAL | 0 refills | Status: AC

## 2020-05-24 MED ORDER — FAMOTIDINE IN NACL 20-0.9 MG/50ML-% IV SOLN
20.0000 mg | Freq: Once | INTRAVENOUS | Status: AC
  Administered 2020-05-24: 20 mg via INTRAVENOUS
  Filled 2020-05-24: qty 50

## 2020-05-24 MED ORDER — FAMOTIDINE 20 MG PO TABS: 20.0000 mg | ORAL_TABLET | Freq: Every day | ORAL | 0 refills | Status: AC

## 2020-05-24 NOTE — ED Triage Notes (Signed)
Patient states he is dehydrated because he has not been drinking water. Patient was vomiting earlier. ETOH on board.

## 2020-05-24 NOTE — ED Notes (Signed)
Patient sent a urine culture to the main lab

## 2020-05-24 NOTE — ED Notes (Signed)
Pt came out to the desk, demanding that his IV be removed and that he wants to leave. IV taken out, PA made aware. Pt did not wait for d/c papers or vital signs to be taken.

## 2020-05-24 NOTE — ED Triage Notes (Addendum)
Pt presents via EMS from home with c/o alcohol intoxication. Pt reports he has been vomiting and had some blood in the vomit the last few times. Pt reports he had 4 large beers today and some liquor last night. IV started by EMS and 4mg  zofran given by EMS en route.
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
## 2020-05-24 NOTE — ED Notes (Signed)
Pt gave his labels to the screeners and he left.

## 2020-05-24 NOTE — ED Triage Notes (Signed)
Per pt, states he thinks he is dehydrated because he has not been drinking water-was here earlier for vomiting sue to [facility] AMA
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
## 2020-05-29 ENCOUNTER — Emergency Department (HOSPITAL_COMMUNITY)
Admission: EM | Admit: 2020-05-29 | Discharge: 2020-05-30 | Disposition: A | Discharge status: Home / Self Care | Payer: Self-pay | Attending: Emergency Medicine | Admitting: Emergency Medicine

## 2020-05-29 ENCOUNTER — Other Ambulatory Visit: Payer: Self-pay

## 2020-05-29 ENCOUNTER — Emergency Department (HOSPITAL_COMMUNITY): Payer: Self-pay

## 2020-05-29 ENCOUNTER — Encounter (HOSPITAL_COMMUNITY): Payer: Self-pay

## 2020-05-29 DIAGNOSIS — F17210 Nicotine dependence, cigarettes, uncomplicated: Secondary | ICD-10-CM | POA: Insufficient documentation

## 2020-05-29 DIAGNOSIS — W108XXA Fall (on) (from) other stairs and steps, initial encounter: Secondary | ICD-10-CM | POA: Insufficient documentation

## 2020-05-29 DIAGNOSIS — Y998 Other external cause status: Secondary | ICD-10-CM | POA: Insufficient documentation

## 2020-05-29 DIAGNOSIS — S42102A Fracture of unspecified part of scapula, left shoulder, initial encounter for closed fracture: Secondary | ICD-10-CM | POA: Insufficient documentation

## 2020-05-29 DIAGNOSIS — Y9389 Activity, other specified: Secondary | ICD-10-CM | POA: Insufficient documentation

## 2020-05-29 DIAGNOSIS — Z20822 Contact with and (suspected) exposure to covid-19: Secondary | ICD-10-CM | POA: Insufficient documentation

## 2020-05-29 DIAGNOSIS — Z7982 Long term (current) use of aspirin: Secondary | ICD-10-CM | POA: Insufficient documentation

## 2020-05-29 DIAGNOSIS — W19XXXA Unspecified fall, initial encounter: Secondary | ICD-10-CM

## 2020-05-29 DIAGNOSIS — Y92009 Unspecified place in unspecified non-institutional (private) residence as the place of occurrence of the external cause: Secondary | ICD-10-CM | POA: Insufficient documentation

## 2020-05-29 NOTE — ED Triage Notes (Signed)
Patient arrived stating he fell on some steps two days ago and is having left arm/shoulder pain. Stating he needs to be out of work because he has to lift for work and it hurts to move it.
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
## 2020-05-30 ENCOUNTER — Encounter (HOSPITAL_COMMUNITY): Payer: Self-pay

## 2020-05-30 ENCOUNTER — Emergency Department (HOSPITAL_COMMUNITY): Payer: Self-pay

## 2020-05-30 LAB — SARS CORONAVIRUS 2 BY RT PCR (HOSPITAL ORDER, PERFORMED IN ~~LOC~~ HOSPITAL LAB): SARS Coronavirus 2: NEGATIVE

## 2020-05-30 LAB — I-STAT CHEM 8, ED
BUN: 10 mg/dL (ref 6–20)
Calcium, Ion: 1.1 mmol/L — ABNORMAL LOW (ref 1.15–1.40)
Chloride: 101 mmol/L (ref 98–111)
Creatinine, Ser: 1.1 mg/dL (ref 0.61–1.24)
Glucose, Bld: 84 mg/dL (ref 70–99)
HCT: 37 % — ABNORMAL LOW (ref 39.0–52.0)
Hemoglobin: 12.6 g/dL — ABNORMAL LOW (ref 13.0–17.0)
Potassium: 4 mmol/L (ref 3.5–5.1)
Sodium: 142 mmol/L (ref 135–145)
TCO2: 28 mmol/L (ref 22–32)

## 2020-05-30 LAB — CBC WITH DIFFERENTIAL/PLATELET
Abs Immature Granulocytes: 0.02 10*3/uL (ref 0.00–0.07)
Basophils Absolute: 0 10*3/uL (ref 0.0–0.1)
Basophils Relative: 0 %
Eosinophils Absolute: 0.1 10*3/uL (ref 0.0–0.5)
Eosinophils Relative: 1 %
HCT: 35.7 % — ABNORMAL LOW (ref 39.0–52.0)
Hemoglobin: 11.9 g/dL — ABNORMAL LOW (ref 13.0–17.0)
Immature Granulocytes: 0 %
Lymphocytes Relative: 34 %
Lymphs Abs: 2.4 10*3/uL (ref 0.7–4.0)
MCH: 31 pg (ref 26.0–34.0)
MCHC: 33.3 g/dL (ref 30.0–36.0)
MCV: 93 fL (ref 80.0–100.0)
Monocytes Absolute: 0.5 10*3/uL (ref 0.1–1.0)
Monocytes Relative: 7 %
Neutro Abs: 3.9 10*3/uL (ref 1.7–7.7)
Neutrophils Relative %: 58 %
Platelets: 226 10*3/uL (ref 150–400)
RBC: 3.84 MIL/uL — ABNORMAL LOW (ref 4.22–5.81)
RDW: 12.5 % (ref 11.5–15.5)
WBC: 6.9 10*3/uL (ref 4.0–10.5)
nRBC: 0 % (ref 0.0–0.2)

## 2020-05-30 MED ORDER — IOHEXOL 300 MG/ML  SOLN
100.0000 mL | Freq: Once | INTRAMUSCULAR | Status: AC | PRN
  Administered 2020-05-30: 100 mL via INTRAVENOUS

## 2020-05-30 MED ORDER — IBUPROFEN 600 MG PO TABS
600.0000 mg | ORAL_TABLET | Freq: Four times a day (QID) | ORAL | 0 refills | Status: AC | PRN
Start: 2020-05-30 — End: ?

## 2020-05-30 MED ORDER — SODIUM CHLORIDE (PF) 0.9 % IJ SOLN
INTRAMUSCULAR | Status: AC
  Filled 2020-05-30: qty 50

## 2020-05-30 NOTE — Discharge Instructions (Addendum)
You may alternate taking Tylenol and Ibuprofen as needed for pain control. You may take 400-600 mg of ibuprofen every 6 hours and [phone] mg of Tylenol every 6 hours. Do not exceed 4000 mg of Tylenol daily as this can lead to liver damage. Also, make sure to take Ibuprofen with meals as it can cause an upset stomach. Do not take other NSAIDs while taking Ibuprofen such as (Aleve, Naprosyn, Aspirin, Celebrex, etc) and do not take more than the prescribed dose as this can lead to ulcers and bleeding in your GI tract. You may use warm and cold compresses to help with your symptoms.   Please follow up with Dr. Aundria Rud within the next 7-10 days for re-evaluation and further treatment of your symptoms.   Please return to the ER sooner if you have any new or worsening symptoms.

## 2020-05-30 NOTE — ED Notes (Signed)
Consult to Orthopedic Surgery@04 :50am.

## 2020-05-30 NOTE — Consult Note (Signed)
Contacted at 5:03AM by PA concerning this patient. Patient had a fall 2 days ago and presented with left arm pain. By report: patient is neurovascularly intact with a closed left scapular fracture.  History and clinical exam per report of PA - no notes were in epic at time of the consultation for review.    CT scan and xrays of the left shoulder where reviewed.  Agree with radiology report.  No indication for acute surgical management at this time recommend sling and swathe and follow-up with Dr. Duwayne Heck in 7-10 days.  Pain medications per ER provider.

## 2020-06-21 ENCOUNTER — Ambulatory Visit
Admission: EM | Admit: 2020-06-21 | Discharge: 2020-06-21 | Disposition: A | Discharge status: Home / Self Care | Payer: Medicaid Other

## 2020-06-21 NOTE — ED Notes (Addendum)
This RN went in to speak to patient who states he needs clearance to go back to work after being out for 1 month.  This RN explained [employer] to patient, patient states they are asking for a $250 copay, which he cannot afford.  This RN explained why they are used for return to work notes after that long, and patient states he doesn't have any other choice.  This RN to have APP speak with patient prior to completing any more triage.

## 2020-06-25 ENCOUNTER — Other Ambulatory Visit: Payer: Self-pay

## 2020-06-25 ENCOUNTER — Ambulatory Visit
Admission: RE | Admit: 2020-06-25 | Discharge: 2020-06-25 | Disposition: A | Discharge status: Home / Self Care | Payer: Medicaid Other | Source: Ambulatory Visit | Attending: Family Medicine | Admitting: Family Medicine

## 2020-06-25 ENCOUNTER — Encounter: Payer: Self-pay | Admitting: Family Medicine

## 2020-06-25 ENCOUNTER — Ambulatory Visit (INDEPENDENT_AMBULATORY_CARE_PROVIDER_SITE_OTHER): Payer: Self-pay | Admitting: Family Medicine

## 2020-06-25 VITALS — BP 110/80 | Ht 72.0 in | Wt 160.0 lb

## 2020-06-25 DIAGNOSIS — M25512 Pain in left shoulder: Secondary | ICD-10-CM

## 2020-06-25 DIAGNOSIS — S42115D Nondisplaced fracture of body of scapula, left shoulder, subsequent encounter for fracture with routine healing: Secondary | ICD-10-CM

## 2020-06-25 DIAGNOSIS — S42102A Fracture of unspecified part of scapula, left shoulder, initial encounter for closed fracture: Secondary | ICD-10-CM | POA: Insufficient documentation

## 2020-06-25 NOTE — Assessment & Plan Note (Signed)
Patient doing well overall and appears to be back to baseline as far as function and pain -We will obtain repeat x-ray of scapula to ensure proper healing -In the absence of something abnormal on imaging, we will write letter for patient to return to work as he requests.  With his level of function in the shoulder at this time I suspect that he will have no problem going back to his normal work tasks
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
## 2020-06-25 NOTE — Patient Instructions (Signed)
Get x-rays of your scapula after you leave today. We will call you with the results and if it's ok to return to work. If these look good, you will just follow up with [place] as needed.

## 2021-04-06 IMAGING — CR DG SCAPULA*L*
2 series · 2 of 2 positions shown · non-contrast
Comparison: None.

CLINICAL DATA: Recent fall with scapular pain, initial encounter

EXAM:
LEFT SCAPULA - 2+ VIEWS

[w scapula y-view left (1 of 2)]
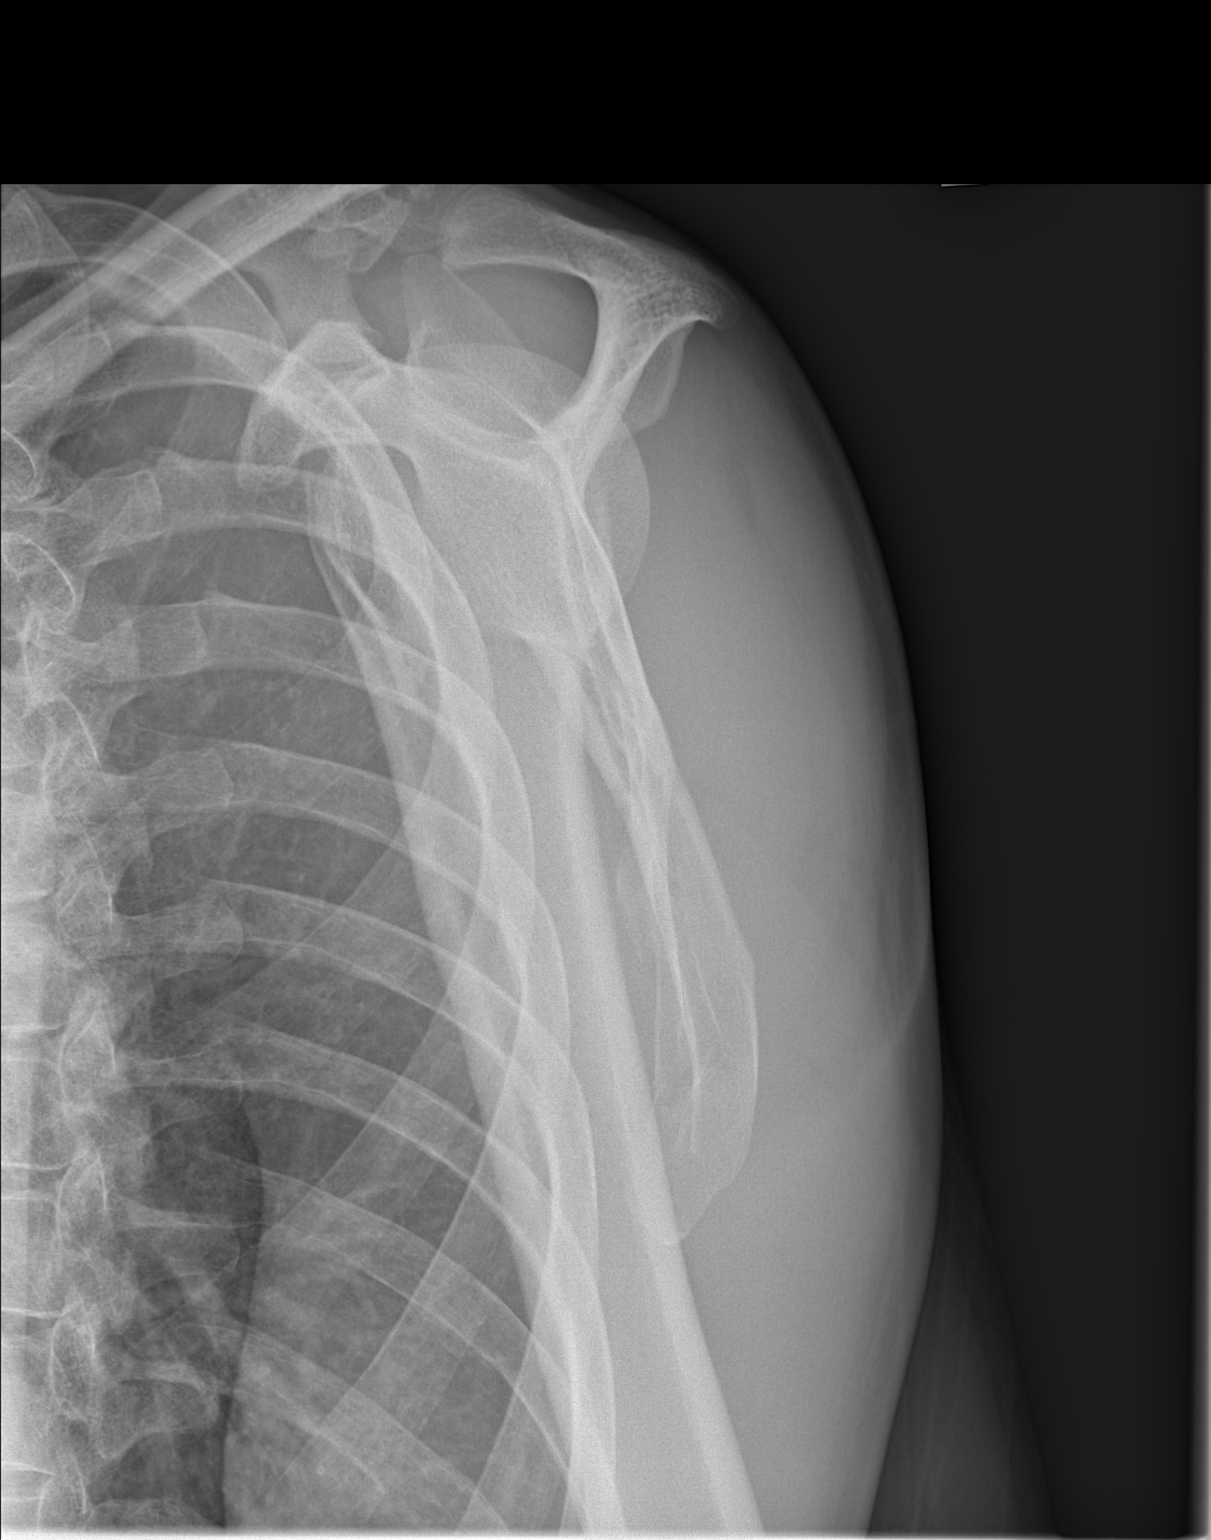

[w scapula y-view left (2 of 2)]
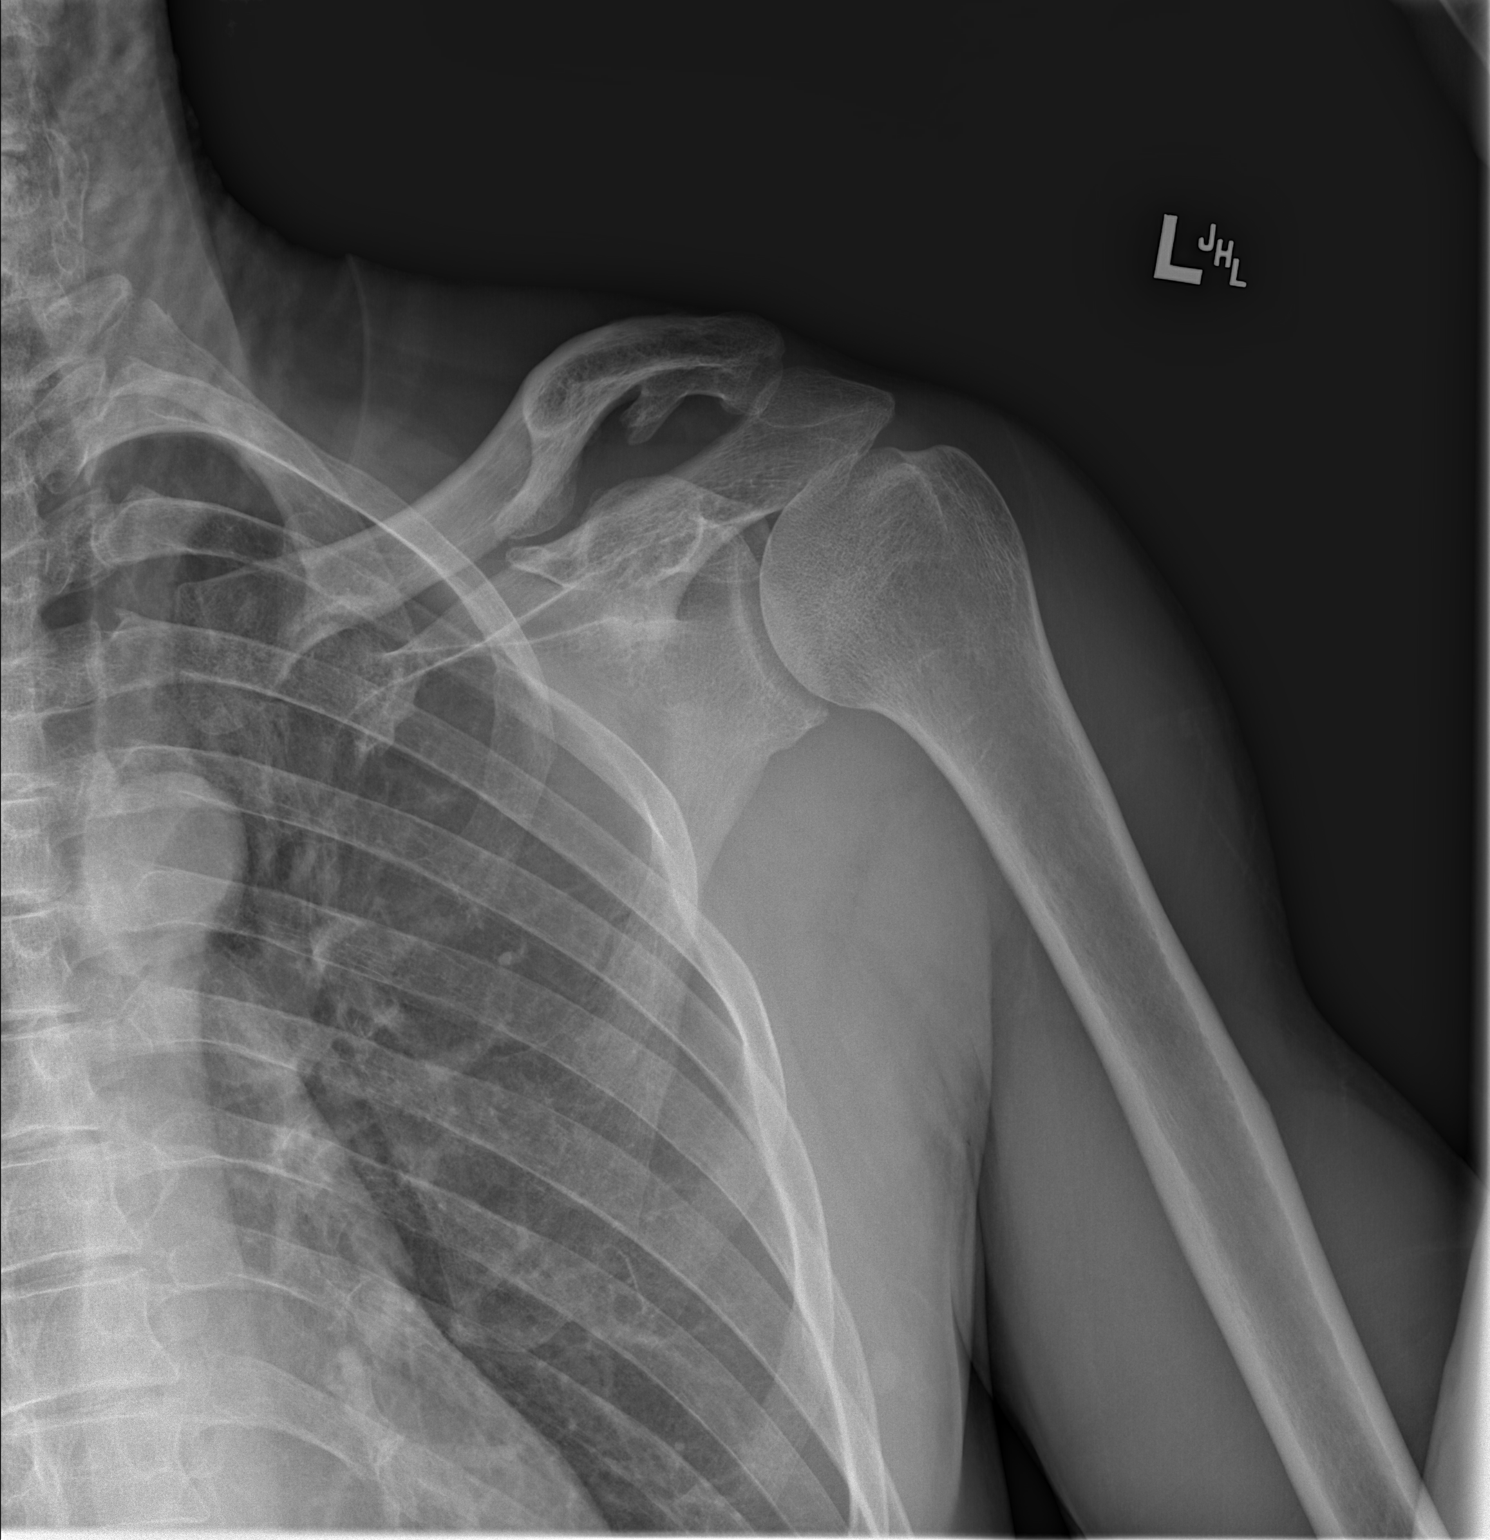

[2 of 2 positions shown; findings below may reference images not displayed]

FINDINGS: Changes suspicious for minimally displaced scapular body fracture.
Cross-sectional imaging may be helpful
IMPRESSION: Changes suspicious for scapular body fracture. Cross-sectional
imaging may be helpful.

## 2021-04-06 IMAGING — CT CT ABD-PELV W/ CM
2 of 5 series · 13 of 36 positions shown, 16 images · IV contrast (OMNIPAQUE 300)
Comparison: None.

CLINICAL DATA: Moderate to severe trauma due to fall down steps.

EXAM:
CT CHEST, ABDOMEN, AND PELVIS WITH CONTRAST
TECHNIQUE: Multidetector CT imaging of the chest, abdomen and pelvis was
performed following the standard protocol during bolus
administration of intravenous contrast.
CONTRAST:  100mL OMNIPAQUE IOHEXOL 300 MG/ML  SOLN

[Series 2: cap with · axial · 0.71mm/px · z∈[+704,+1244]mm · 10 of 132 slices shown, 13 images]
[im 12/132  mediastinal]
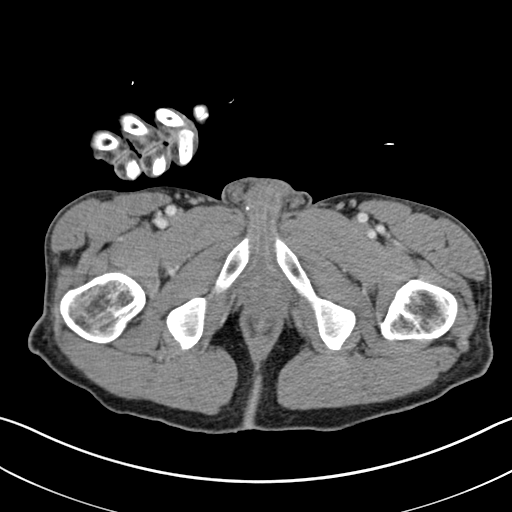
[im 12/132  lung]
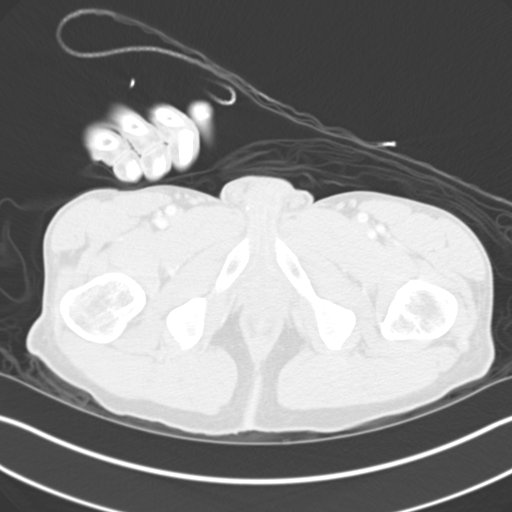
[im 24/132  lung]
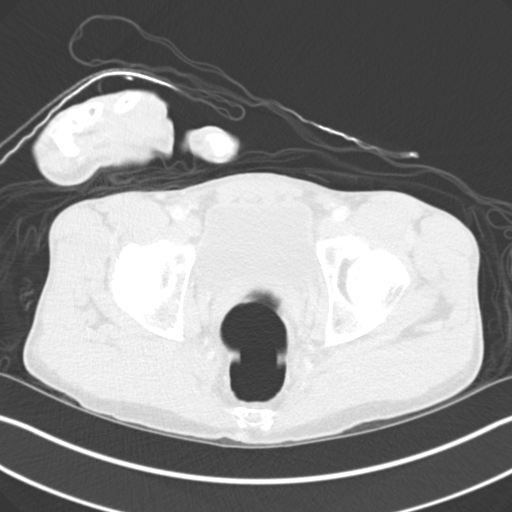
[im 36/132  lung]
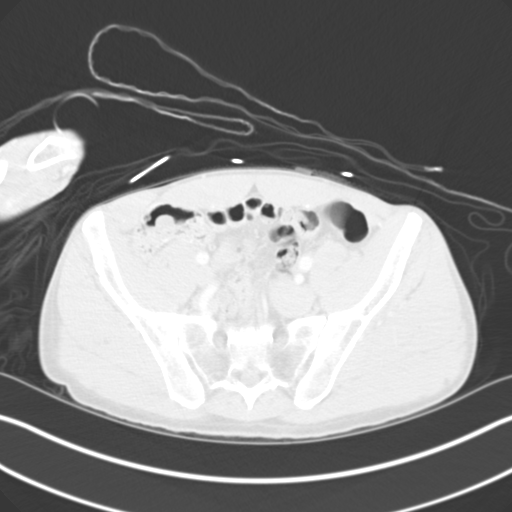
[im 48/132  lung]
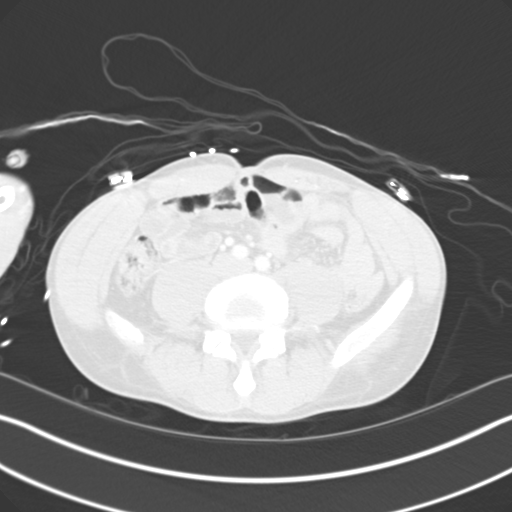
[im 60/132  mediastinal]
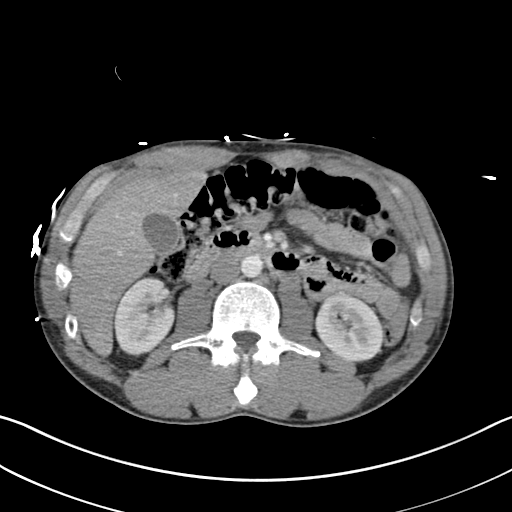
[im 60/132  lung]
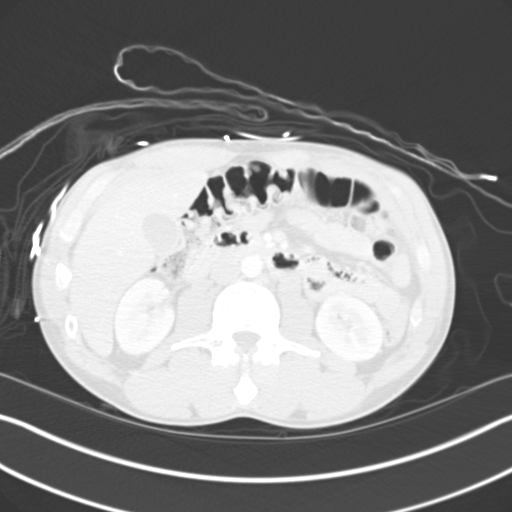
[im 72/132  lung]
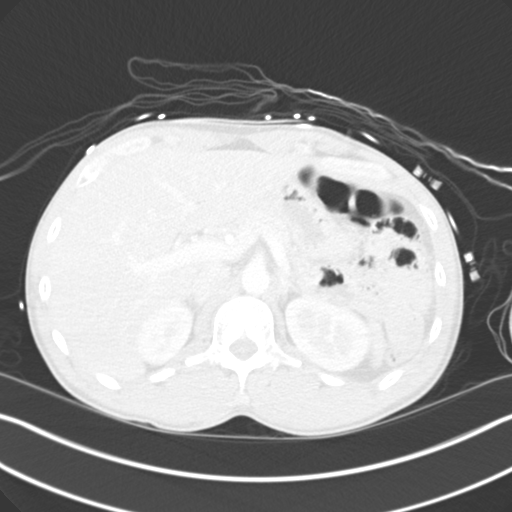
[im 84/132  lung]
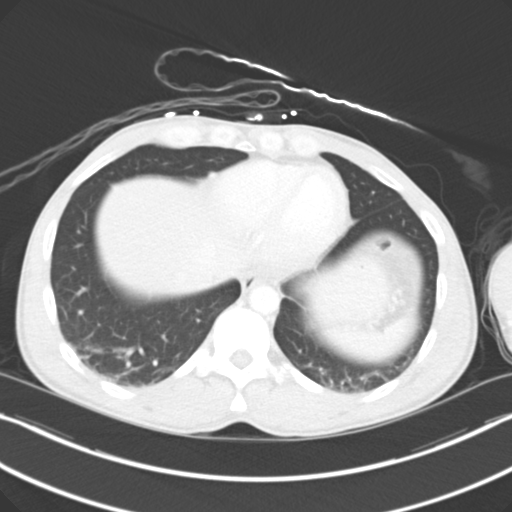
[im 96/132  lung]
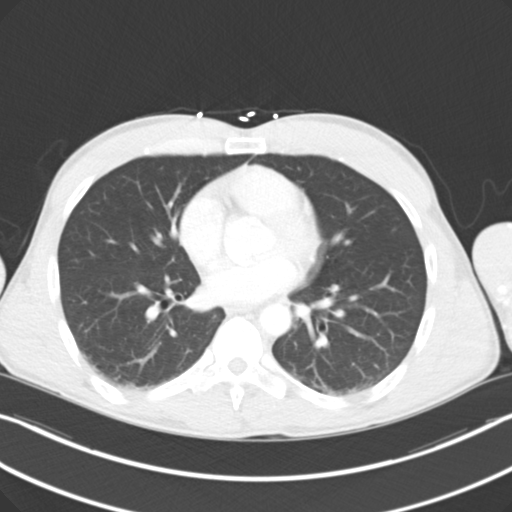
[im 108/132  mediastinal]
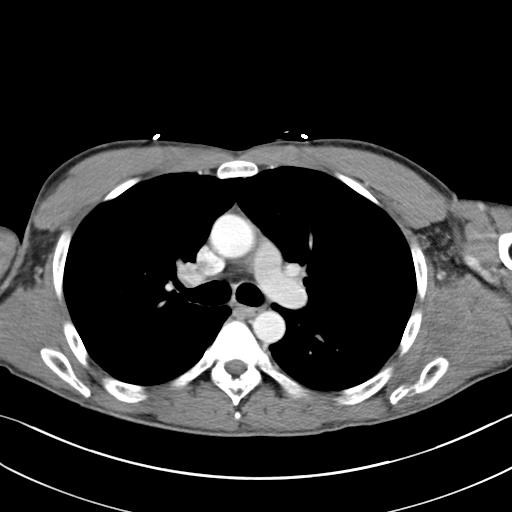
[im 108/132  lung]
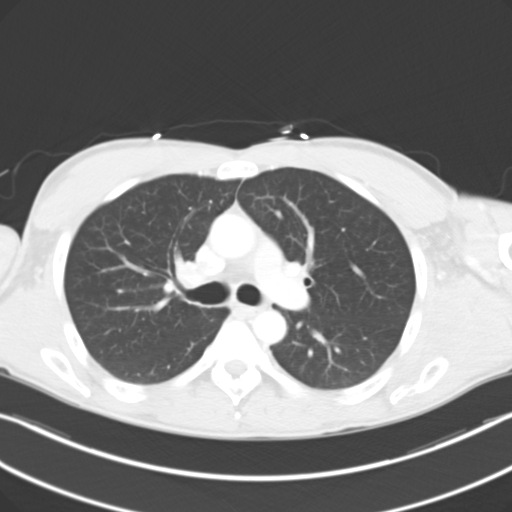
[im 120/132  lung]
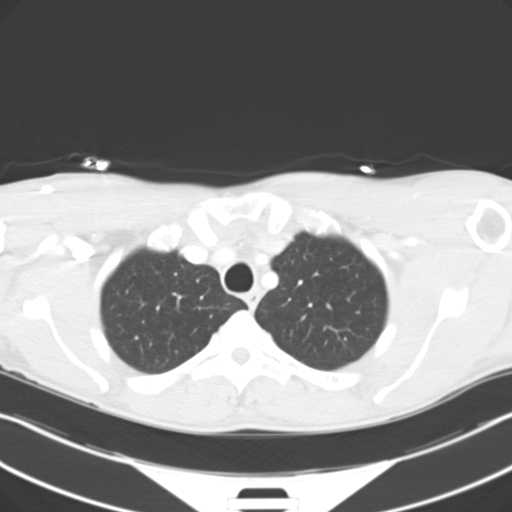

[Series 4: coronals · coronal · 0.66mm/px · 3 of 116 slices shown]
[im 24/116  lung]
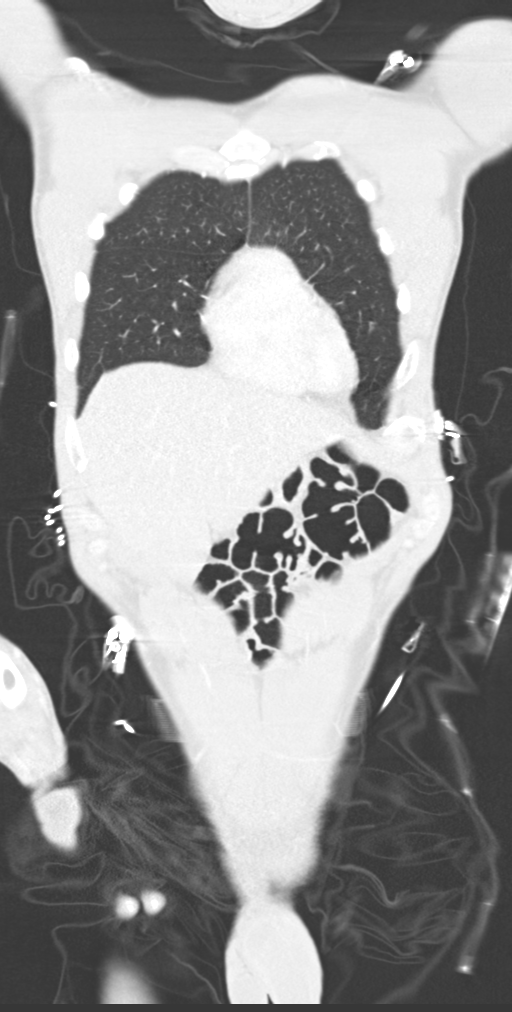
[im 47/116  lung]
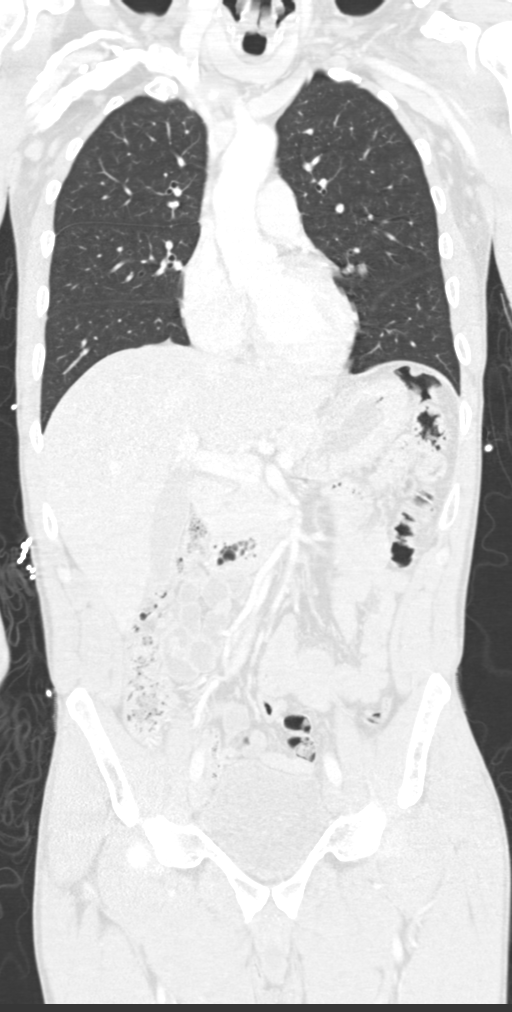
[im 70/116  lung]
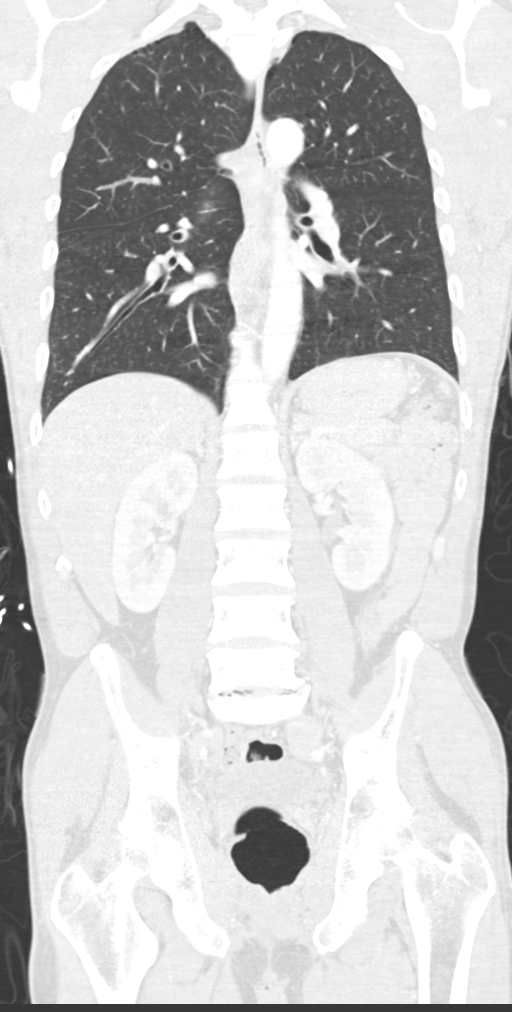

[13 of 36 positions shown; findings below may reference images not displayed]

FINDINGS: CT CHEST FINDINGS

Cardiovascular: No significant vascular findings. Normal heart size.
No pericardial effusion.

Mediastinum/Nodes: No pneumomediastinum or hematoma

Lungs/Pleura: No hemothorax, pneumothorax, or lung contusion. Mild
dependent atelectasis. Mild paraseptal emphysema at the apices.

Musculoskeletal: Comminuted inferior scapular body fracture on the
left reaching the inferior angle, with buckling of the fracture and
regional muscular swelling. Prior injury at the left AC joint with
ossification along the coracoclavicular ligament.

CT ABDOMEN PELVIS FINDINGS

Hepatobiliary: No hepatic injury or perihepatic hematoma.
Gallbladder is unremarkable

Pancreas: Negative

Spleen: No splenic injury or perisplenic hematoma.

Adrenals/Urinary Tract: No adrenal hemorrhage or renal injury
identified. Bladder is unremarkable.

Stomach/Bowel: No evidence of injury

Vascular/Lymphatic: No evidence of injury

Reproductive: Negative

Other: No ascites or pneumoperitoneum

Musculoskeletal: Negative for fracture or subluxation.
IMPRESSION: 1. Comminuted left scapular body fracture.
2. No evidence of intrathoracic or intra-abdominal injury.

## 2021-04-06 IMAGING — CT CT CERVICAL SPINE W/O CM
3 of 4 series · 10 of 33 positions shown, 12 images · non-contrast
Comparison: CT and MR 02/14/2017

CLINICAL DATA: Fall down steps on 05/27/2020

EXAM:
CT HEAD WITHOUT CONTRAST
CT CERVICAL SPINE WITHOUT CONTRAST
TECHNIQUE: Multidetector CT imaging of the head and cervical spine was
performed following the standard protocol without intravenous
contrast. Multiplanar CT image reconstructions of the cervical spine
were also generated.

[Series 5: orthogonal bone · axial · 0.18mm/px · z∈[+1299,+1356]mm · 2 of 96 slices shown, 3 images]
[im 32/96  soft-tissue]
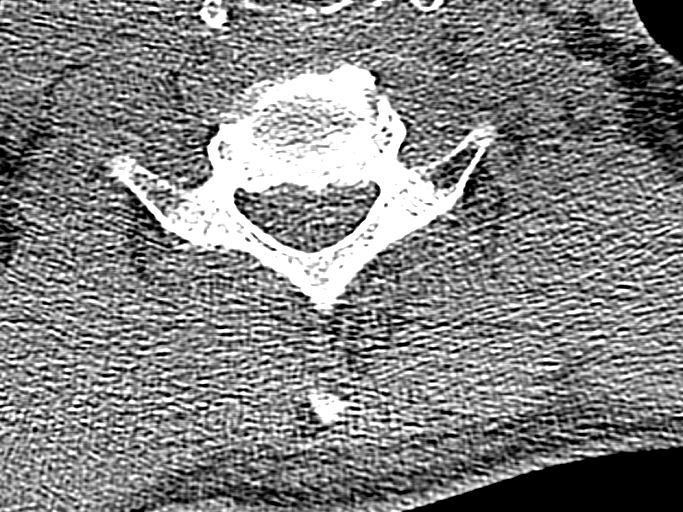
[im 32/96  bone]
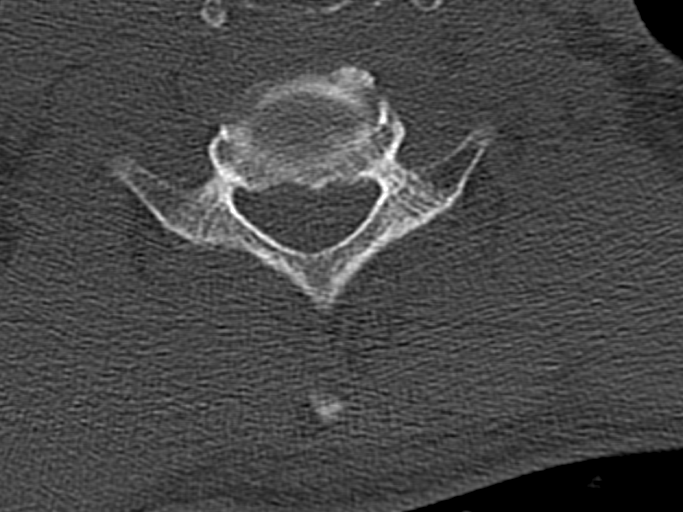
[im 64/96  bone]
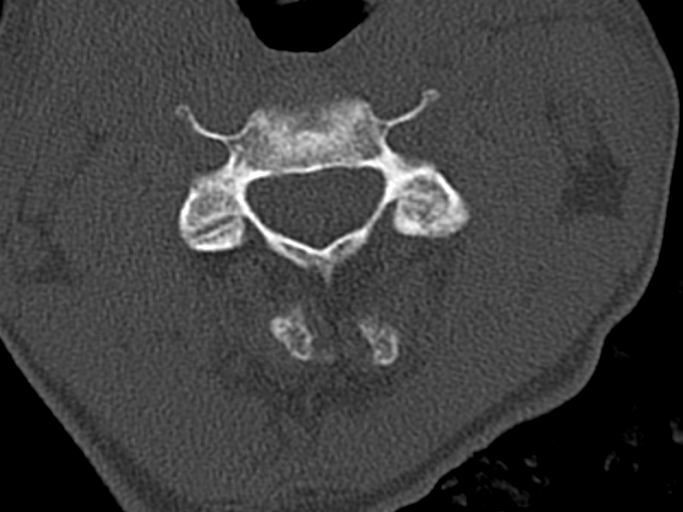

[Series 6: coronal bone · coronal · 0.26mm/px · 3 of 47 slices shown]
[im 10/47  bone]
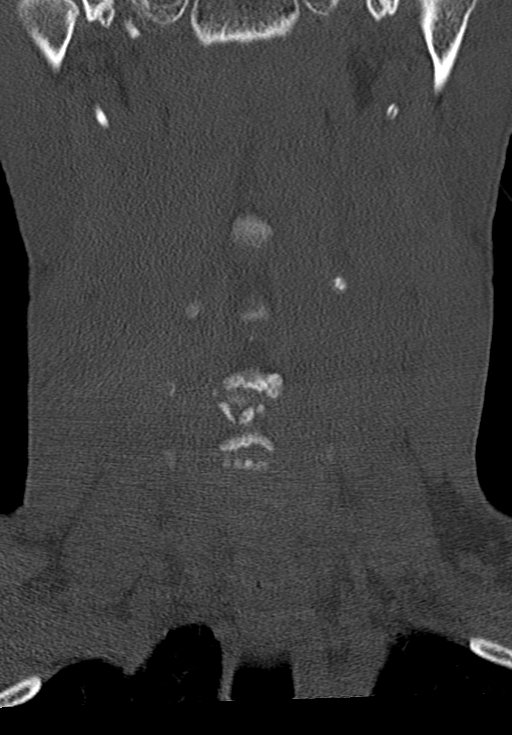
[im 19/47  bone]
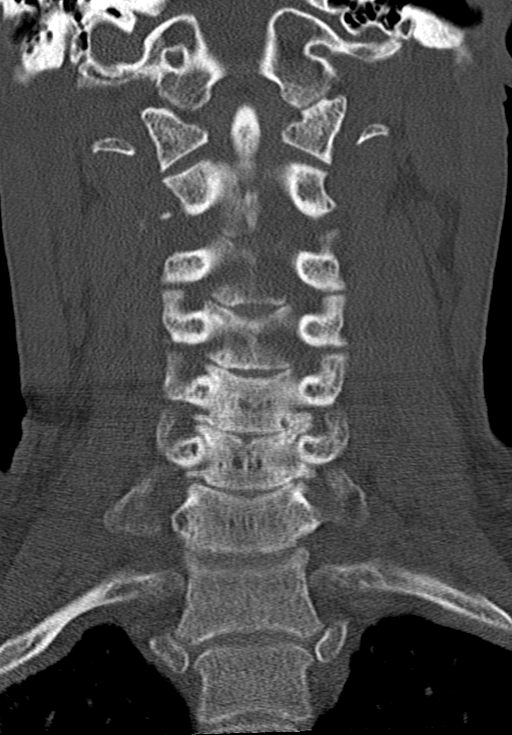
[im 28/47  bone]
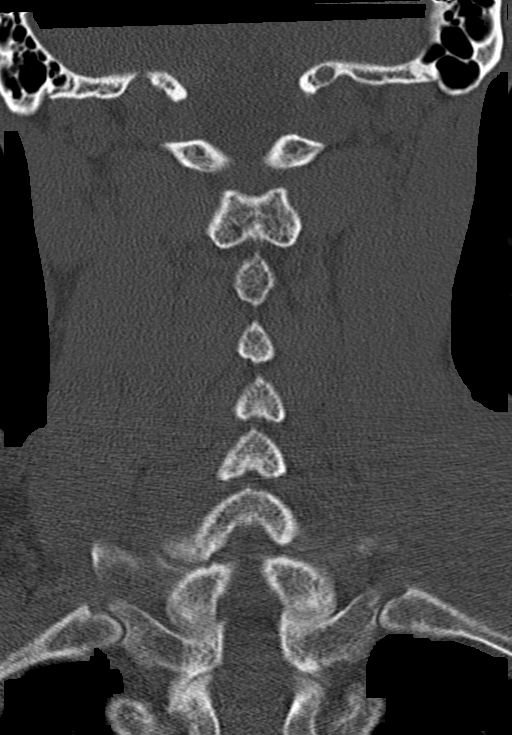

[Series 7: sagittal bone · sagittal · 0.21mm/px · 5 of 61 slices shown, 6 images]
[im 21/61  bone]
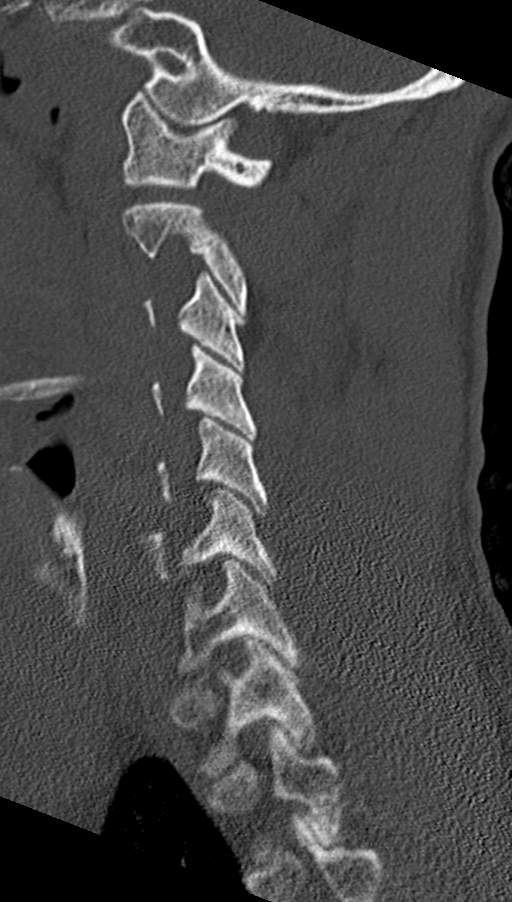
[im 26/61  bone]
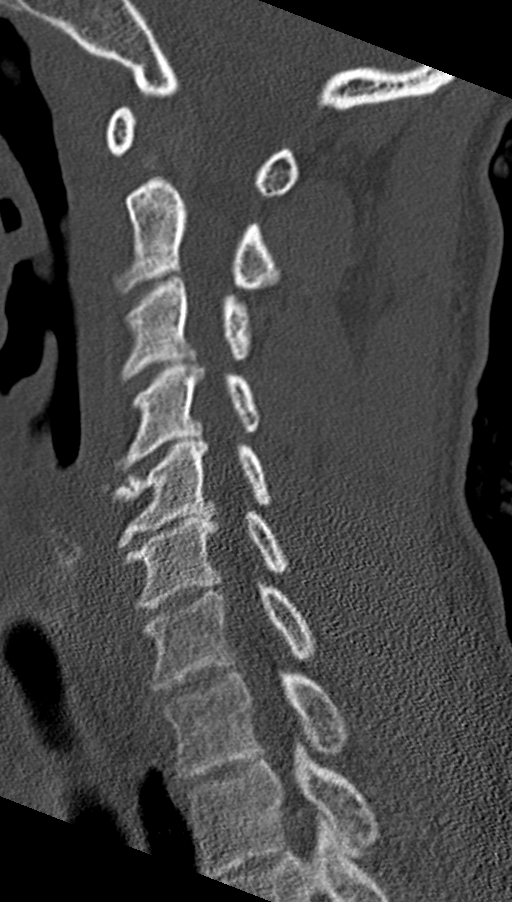
[im 31/61  soft-tissue]
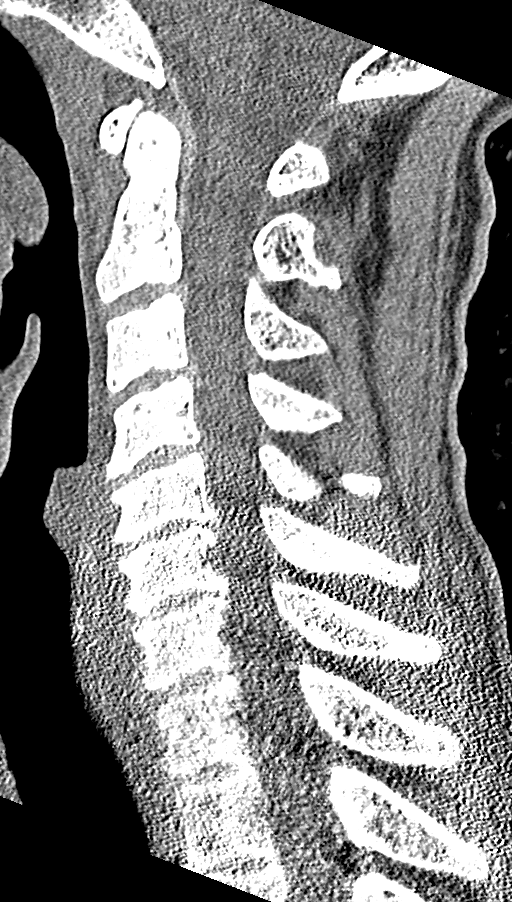
[im 31/61  bone]
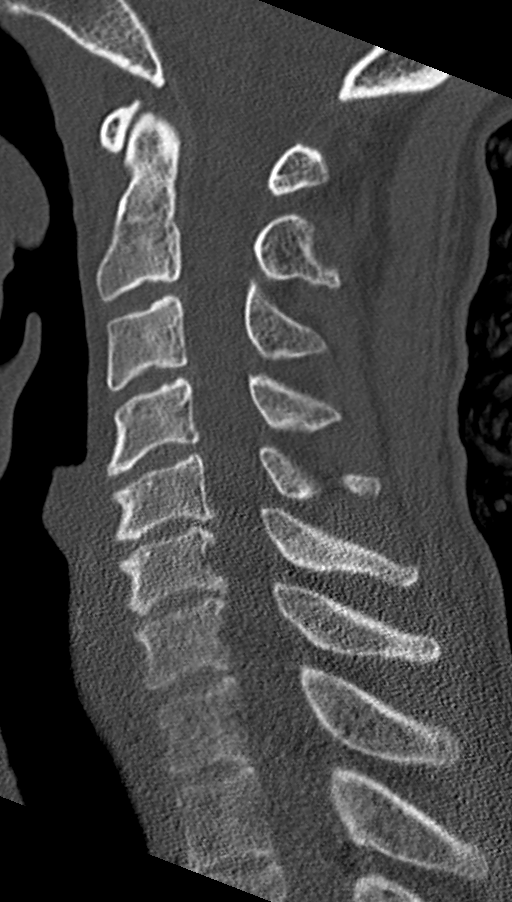
[im 36/61  bone]
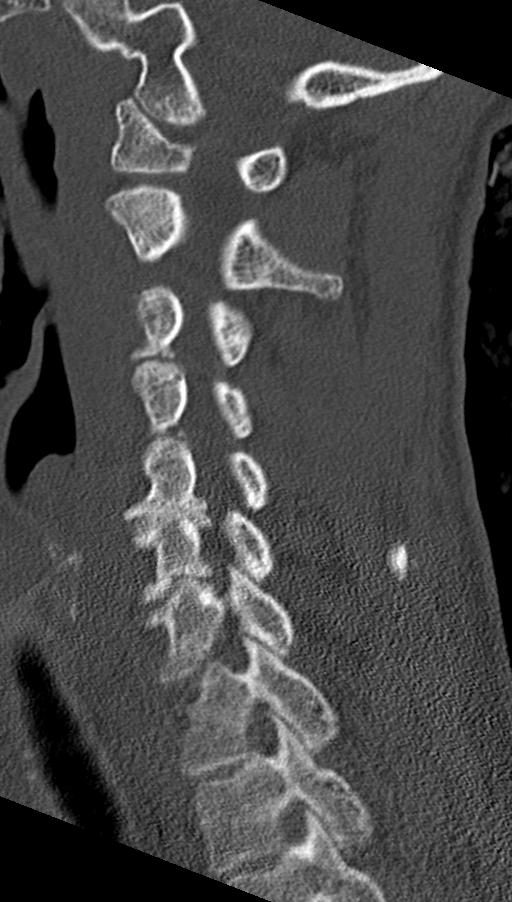
[im 41/61  bone]
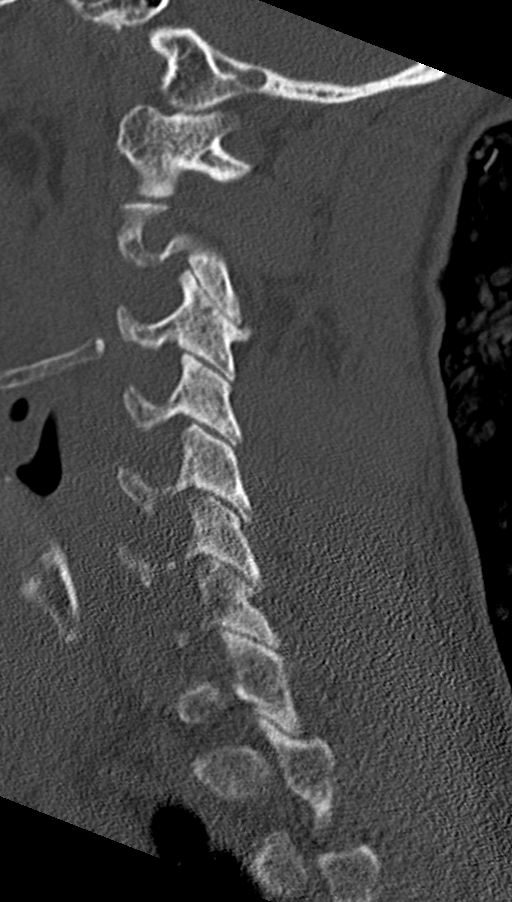

[10 of 33 positions shown; findings below may reference images not displayed]

FINDINGS: CT HEAD FINDINGS

Brain: Stable areas of inferior left frontal and anterior left
temporal pole encephalomalacia. No evidence of acute infarction,
hemorrhage, hydrocephalus, extra-axial collection or mass
lesion/mass effect.

Vascular: No hyperdense vessel or unexpected calcification.

Skull: Few sites of chronic scalp infiltration, likely scarring. No
significant swelling or hematoma. No acute calvarial fracture,
visible facial bone fracture or other acute or worrisome osseous
lesions.

Sinuses/Orbits: Paranasal sinuses and mastoid air cells are
predominantly clear. Included orbital structures are unremarkable.

Other: None

CT CERVICAL SPINE FINDINGS

Alignment: Stabilization collar absent at the time of examination.
There is mild straightening of the normal cervical lordosis. No
evidence of traumatic listhesis. No abnormally widened, perched or
jumped facets. Normal alignment of the craniocervical and
atlantoaxial articulations.

Skull base and vertebrae: Acute skull base fracture. No vertebral
body fracture or height loss. Posterior elements are intact. Normal
bone mineralization. No worrisome osseous lesions.

Soft tissues and spinal canal: No pre or paravertebral fluid or
swelling. No visible canal hematoma.

Disc levels: Multilevel intervertebral disc height loss with
spondylitic endplate changes. Posterior disc osteophyte complexes
are maximal C5-6, C6-7 resulting in at most mild canal stenosis.
Multilevel uncinate spurring and facet arthropathy results in some
mild to moderate foraminal narrowing C5-C7 and on the right C3-C5.

Upper chest: No acute abnormality in the upper chest or imaged lung
apices. Some paraseptal emphysematous changes are noted.

Other: Normal thyroid.
IMPRESSION: 1. No acute intracranial abnormality.
2. Stable areas of inferior left frontal and anterior left temporal
pole encephalomalacia.
3. No acute cervical spine fracture or traumatic listhesis.
4. Multilevel degenerative changes of the cervical spine as
described above.

## 2024-03-25 ENCOUNTER — Emergency Department (HOSPITAL_COMMUNITY)
Admission: EM | Admit: 2024-03-25 | Discharge: 2024-03-25 | Discharge status: Against Medical Advice | Attending: Emergency Medicine | Admitting: Emergency Medicine

## 2024-03-25 DIAGNOSIS — X58XXXA Exposure to other specified factors, initial encounter: Secondary | ICD-10-CM | POA: Diagnosis not present

## 2024-03-25 DIAGNOSIS — S0181XA Laceration without foreign body of other part of head, initial encounter: Secondary | ICD-10-CM | POA: Insufficient documentation

## 2024-03-25 DIAGNOSIS — R4182 Altered mental status, unspecified: Secondary | ICD-10-CM | POA: Insufficient documentation

## 2024-03-25 DIAGNOSIS — Z5321 Procedure and treatment not carried out due to patient leaving prior to being seen by health care provider: Secondary | ICD-10-CM | POA: Insufficient documentation
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
## 2024-03-25 NOTE — ED Notes (Signed)
 Patient asked where the door out is and left. Patient ambulated to exit independently with no distress noted.

## 2024-06-23 ENCOUNTER — Encounter (HOSPITAL_COMMUNITY): Payer: Self-pay

## 2024-06-23 ENCOUNTER — Emergency Department (HOSPITAL_COMMUNITY)
Admission: EM | Admit: 2024-06-23 | Discharge: 2024-06-23 | Disposition: A | Discharge status: Home / Self Care | Attending: Emergency Medicine | Admitting: Emergency Medicine

## 2024-06-23 ENCOUNTER — Emergency Department (HOSPITAL_COMMUNITY)

## 2024-06-23 ENCOUNTER — Other Ambulatory Visit: Payer: Self-pay

## 2024-06-23 DIAGNOSIS — S32038A Other fracture of third lumbar vertebra, initial encounter for closed fracture: Secondary | ICD-10-CM | POA: Diagnosis not present

## 2024-06-23 DIAGNOSIS — M545 Low back pain, unspecified: Secondary | ICD-10-CM | POA: Diagnosis present

## 2024-06-23 DIAGNOSIS — S32000A Wedge compression fracture of unspecified lumbar vertebra, initial encounter for closed fracture: Secondary | ICD-10-CM

## 2024-06-23 DIAGNOSIS — W108XXA Fall (on) (from) other stairs and steps, initial encounter: Secondary | ICD-10-CM | POA: Diagnosis not present

## 2024-06-23 DIAGNOSIS — Z7982 Long term (current) use of aspirin: Secondary | ICD-10-CM | POA: Insufficient documentation

## 2024-06-23 DIAGNOSIS — S3210XA Unspecified fracture of sacrum, initial encounter for closed fracture: Secondary | ICD-10-CM | POA: Diagnosis not present

## 2024-06-23 MED ORDER — LIDOCAINE 5 % EX PTCH
1.0000 | MEDICATED_PATCH | CUTANEOUS | Status: DC
  Administered 2024-06-23: 1 via TRANSDERMAL
  Filled 2024-06-23 (×2): qty 1

## 2024-06-23 MED ORDER — LIDOCAINE 5 % EX PTCH: 1.0000 | MEDICATED_PATCH | CUTANEOUS | 0 refills | Status: AC

## 2024-06-23 MED ORDER — HYDROCODONE-ACETAMINOPHEN 5-325 MG PO TABS
2.0000 | ORAL_TABLET | Freq: Once | ORAL | Status: AC
  Administered 2024-06-23: 2 via ORAL
  Filled 2024-06-23: qty 2

## 2024-06-23 MED ORDER — HYDROCODONE-ACETAMINOPHEN 5-325 MG PO TABS: 1.0000 | ORAL_TABLET | ORAL | 0 refills | Status: AC | PRN

## 2024-06-23 MED ORDER — CLOTRIMAZOLE 1 % EX CREA: TOPICAL_CREAM | CUTANEOUS | 0 refills | Status: AC

## 2024-06-23 NOTE — ED Notes (Signed)
 Ortho tech called

## 2024-06-23 NOTE — ED Notes (Signed)
 EDP at bedside

## 2024-06-23 NOTE — ED Triage Notes (Signed)
 POV/ ambulatory/ fall down stairs on Tuesday/ injured lower back/ ibuprofen  not helping/ A&OX4
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
## 2024-06-23 NOTE — Discharge Instructions (Addendum)
 Wear the brace as discussed.  You can use ibuprofen  and the Lidoderm  patches for pain.  You have also been given a prescription for hydrocodone  to use for pain.  Do not take any additional acetaminophen  (Tylenol ) while you are taking the hydrocodone .  You should take a stool softener along with the pain medication.  Make an appointment to follow-up with Dr. Louis.  Turn to the emergency room if you have any worsening symptoms.  Use the cream to your genital area for the rash.  Follow-up with your primary care doctor if it is not improving.

## 2024-06-23 NOTE — ED Notes (Signed)
 Patient transported to CT

## 2024-06-23 NOTE — ED Notes (Signed)
 Patient transported to X-ray

## 2024-06-23 NOTE — Progress Notes (Signed)
 Orthopedic Tech Progress Note Patient Details:  Martin Neal. [birth year] [id number]  Ortho Devices Type of Ortho Device: Lumbar corsett Ortho Device/Splint Location: Back Ortho Device/Splint Interventions: Ordered, Application   Post Interventions Patient Tolerated: Well  Martin Neal A Lun Muro 06/23/2024, 3:41 PM

## 2024-09-05 ENCOUNTER — Ambulatory Visit: Admitting: Physician Assistant

## 2024-09-05 ENCOUNTER — Encounter: Payer: Self-pay | Admitting: Physician Assistant

## 2024-09-05 VITALS — BP 133/80 | HR 68

## 2024-09-05 DIAGNOSIS — L720 Epidermal cyst: Secondary | ICD-10-CM

## 2024-09-05 DIAGNOSIS — L729 Follicular cyst of the skin and subcutaneous tissue, unspecified: Secondary | ICD-10-CM

## 2024-09-05 NOTE — Patient Instructions (Signed)

## 2024-09-05 NOTE — Progress Notes (Signed)
   New Patient Visit   Subjective  Martin Neal. is a 48 y.o. male NEW PATIENT who presents for the following: Cyst  Patient states she has cyst located at the right lateral face that he would like to have examined. Patient reports the areas have been there for 1 + years. Is here to discuss excision.    The following portions of the chart were reviewed this encounter and updated as appropriate: medications, allergies, medical history  Review of Systems:  No other skin or systemic complaints except as noted in HPI or Assessment and Plan.  Objective  Well appearing patient in no apparent distress; mood and affect are within normal limits.  A focused examination was performed of the following areas: face  Relevant exam findings are noted in the Assessment and Plan.  2 cm subcutaneous nodule right mandible.        Assessment & Plan   EPIDERMAL INCLUSION CYST Exam: Subcutaneous nodule at right mandible.   Benign-appearing. Exam most consistent with an epidermal inclusion cyst. Discussed that a cyst is a benign growth that can grow over time and sometimes get irritated or inflamed. Recommend observation if it is not bothersome. Discussed option of surgical excision to remove it if it is growing, symptomatic, or other changes noted. Please call for new or changing lesions so they can be evaluated.  Patient elects to proceed with excision. Plan referral to plastic surgery.      FOLLICULAR CYST OF SKIN AND SUBCUTANEOUS TISSUE   Related Procedures Ambulatory referral to Plastic Surgery  Return if symptoms worsen or fail to improve.  I, Martin Neal, CMA, am acting as scribe for Martin Barnhardt K, PA-C.   Documentation: I have reviewed the above documentation for accuracy and completeness, and I agree with the above.  Martin Schauf K, PA-C

## 2024-09-07 ENCOUNTER — Ambulatory Visit

## 2024-09-07 ENCOUNTER — Institutional Professional Consult (permissible substitution): Admitting: Physician Assistant

## 2024-09-07 VITALS — BP 117/84 | HR 86 | Ht 73.0 in | Wt 155.4 lb

## 2024-09-07 DIAGNOSIS — Z720 Tobacco use: Secondary | ICD-10-CM

## 2024-09-07 DIAGNOSIS — L720 Epidermal cyst: Secondary | ICD-10-CM

## 2024-09-07 DIAGNOSIS — R220 Localized swelling, mass and lump, head: Secondary | ICD-10-CM | POA: Diagnosis not present
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
## 2024-09-08 ENCOUNTER — Telehealth: Payer: Self-pay

## 2024-09-26 ENCOUNTER — Ambulatory Visit

## 2024-09-26 ENCOUNTER — Other Ambulatory Visit (HOSPITAL_COMMUNITY)
Admission: RE | Admit: 2024-09-26 | Discharge: 2024-09-26 | Disposition: A | Discharge status: Home / Self Care | Source: Ambulatory Visit

## 2024-09-26 DIAGNOSIS — L720 Epidermal cyst: Secondary | ICD-10-CM | POA: Diagnosis present

## 2024-09-26 NOTE — Progress Notes (Addendum)
 PROCEDURE NOTE PLASTIC SURGERY PROCEDURE NOTE   Procedure: Excision of right face mass   Diagnosis: Mass of right face   Surgeon: Nieves HERO. Shacora Zynda, MD   Anesthesia: 1% lidocaine  with epinephrine 1:100,000 Mixed with 0.25% Marcaine 50/50 dilution (total  5 mL)   Complications: None   Specimen: Cyst, sent to phatology    DESCRIPTION OF THE PROCEDURE IN DETAIL:   We discussed the risks benefits and alternatives to lesion excision. We discussed the alternatives which would be observation of the lesion. We discussed of the risks of the lesion then becoming larger or becoming infected, complicating any future excision. We discussed the benefits of excision which include removal of the lesion and the ability to examine it under a microscope to rule out the presence of a malignancy. We also discussed the benefits of removing the lesion from a symptomatic perspective. We discussed the risks of surgery, including but not limited to infection, bleeding, damage to the surrounding healthy tissues and the need for further surgery. We also discussed the risks of wound separation, poor scarring and lesion recurrence. We also discussed the risks of anesthesia. The patient again voiced their understanding of the above and once again confirmed informed consent.   The patient was identified by name, date of birth and medical record number. The patient's face was prepped and draped in the standard sterile fashion using betadine solution. Timeout was conducted and preoperative instrument and needle counts were correct.    Local anesthesia was injected. The incision was then made on top of the face mass. The mass was circumferentially dissected between the dermis and the SMAS, and it should be noted that it had cystic appearance. The size of the mass was 2 cm x 3 cm x 1 cm. The mass was removed and sent as a pathological specimen. The residual 3 centimeter wound was closed in layers using 3-0 Monocryl and 4-0  Nylon. The incision was dressed with bacitracin.    The patient tolerated the procedure well. There were no immediate complications. Postoperative instrument and needle counts were correct.    Postoperative care discussed, return to clinic in 14 days.   Corisa Montini M. Greyson Riccardi, MD [facility]

## 2024-09-26 NOTE — Addendum Note (Signed)
 Addended by: EUSTACIO POUR on: 09/26/2024 01:35 PM   Modules accepted: Level of Service

## 2024-09-28 LAB — SURGICAL PATHOLOGY

## 2024-10-07 ENCOUNTER — Ambulatory Visit (INDEPENDENT_AMBULATORY_CARE_PROVIDER_SITE_OTHER): Payer: Self-pay

## 2024-10-07 VITALS — BP 112/73 | HR 98

## 2024-10-07 DIAGNOSIS — L720 Epidermal cyst: Secondary | ICD-10-CM

## 2024-10-07 DIAGNOSIS — Z09 Encounter for follow-up examination after completed treatment for conditions other than malignant neoplasm: Secondary | ICD-10-CM

## 2024-10-07 NOTE — Progress Notes (Signed)
   Established Patient Office Visit  Subjective   Patient ID: Martin Manville., male    DOB: Nov 26, 1975  Age: 48 y.o. MRN: [id number]  Chief Complaint  Patient presents with   Follow-up    HPI Status post excision of right face mass.  Patient has been healing without any issues, yet he shaved today and cut some of the stitches.  Pain has been under control.  No signs of infection.  Pathology report discussed with patient, epidermal inclusion cyst.     Objective:     BP 112/73 (BP Location: Left Arm, Patient Position: Sitting, Cuff Size: Normal)   Pulse 98   SpO2 97%  BP Readings from Last 3 Encounters:  10/07/24 112/73  09/07/24 117/84  09/05/24 133/80      Physical Exam Right facial incision clean dry intact stitches partially in place.  No signs of infection.  Some skin irritation from the Band-Aid.  MA as chaperone.   No results found for any visits on 10/07/24.    The ASCVD Risk score (Arnett DK, et al., 2019) failed to calculate for the following reasons:   Cannot find a previous HDL lab   Cannot find a previous total cholesterol lab    Assessment & Plan:   Problem List Items Addressed This Visit   None Visit Diagnoses       Follow-up exam    -  Primary     Epidermal inclusion cyst          Doing well after procedure.  Stitches removed today. Apply Vaseline to the wound for a week. Avoid shaving the area for another week. Follow-up with me in 3 months for checkup. Call earlier if any questions or concerns.  Iola Turri M Mai Longnecker, MD

## 2024-10-12 ENCOUNTER — Telehealth: Payer: Self-pay

## 2024-10-12 ENCOUNTER — Other Ambulatory Visit: Payer: Self-pay

## 2024-10-12 MED ORDER — SULFAMETHOXAZOLE-TRIMETHOPRIM 800-160 MG PO TABS: 1.0000 | ORAL_TABLET | Freq: Two times a day (BID) | ORAL | 0 refills | Status: DC

## 2024-10-12 NOTE — Telephone Encounter (Signed)
 Face cyst excision PX:11-03  Pt stated that face is red and swollen and wants to know what is wrong with him, Please call pt

## 2024-10-13 ENCOUNTER — Ambulatory Visit: Admitting: Student

## 2024-10-13 ENCOUNTER — Telehealth: Payer: Self-pay

## 2024-10-13 ENCOUNTER — Encounter: Payer: Self-pay | Admitting: Student

## 2024-10-13 VITALS — BP 118/81 | HR 99

## 2024-10-13 DIAGNOSIS — Z09 Encounter for follow-up examination after completed treatment for conditions other than malignant neoplasm: Secondary | ICD-10-CM

## 2024-10-13 DIAGNOSIS — Z9889 Other specified postprocedural states: Secondary | ICD-10-CM

## 2024-10-13 MED ORDER — SULFAMETHOXAZOLE-TRIMETHOPRIM 800-160 MG PO TABS: 1.0000 | ORAL_TABLET | Freq: Two times a day (BID) | ORAL | 0 refills | Status: AC

## 2024-10-13 NOTE — Progress Notes (Addendum)
 Patient is a 48 year old male who recently underwent excision of right face mass with Dr. Montorfano on 09/26/2024.  During the procedure, the 3 cm wound was closed with 3-0 Monocryl and 4-0 nylon.  Patient is about 2-1/2 weeks out from his procedure.  He presents to the clinic today for postprocedural follow-up.  Patient was last seen in the clinic on 10/07/2024.  At this visit, patient reported he had been healing without any issues, he shaved and cut some of the stitches.  Pain was under control.  There were no signs of infection.  Pathology showed epidermal inclusion cyst.  On exam, right facial incision was clean dry and intact and stitches were partially in place with some skin irritation around.  It was recommended that patient apply Vaseline to the wound for a week and avoid shaving the area for another week.  Today, patient reports he is doing okay.  He states that starting on Tuesday he noticed swelling and redness to the procedure site.  He reports that there was an area draining at 1 point, but it was not from the surgical site itself.  He states that the area is a little bit itchy.  He denies any fevers or chills.  He denies any pain to the area.  He denies any difficulty breathing.  Patient states that he was sent in antibiotics yesterday, but he did not pick them up because he needs them at a different pharmacy.  On exam, patient is sitting upright in no acute distress.  There is an area of erythema and swelling noted to the right chin.  Skin does appear to be somewhat dry, but incision does appear to be intact.  There is no tenderness to palpation.  There is no fluctuance or crepitus noted on exam.  There is no active drainage on exam.  Bactrim  was sent to patient's requested pharmacy.  I discussed with him that I would like him to start these today.  I discussed with him to continue to monitor the area and if it worsens he needs to go to the emergency room or if he develops any difficulty  breathing he needs to go to the emergency room.  Patient expressed understanding.  Discussed with the patient that he can apply Vaseline over the area 1-2 times daily to help with some of the dryness/itchiness.  Discussed with the patient the importance of smoking cessation.  He expressed understanding.  Patient to follow back up next week.  Instructed him to call with any questions or concerns about anything.  Pictures were obtained of the patient and placed in the chart with the patient's or guardian's permission.   Discussed the plan with Dr. Montorfano and he was in agreement.

## 2024-10-13 NOTE — Telephone Encounter (Signed)
 Patient calling in stating [facility] states they do not have rx that was faxed over yesterday. Patients appt is today

## 2024-10-18 ENCOUNTER — Ambulatory Visit: Admitting: Student

## 2024-10-18 VITALS — BP 130/82 | HR 78

## 2024-10-18 DIAGNOSIS — Z09 Encounter for follow-up examination after completed treatment for conditions other than malignant neoplasm: Secondary | ICD-10-CM

## 2024-10-18 DIAGNOSIS — Z9889 Other specified postprocedural states: Secondary | ICD-10-CM

## 2024-10-18 NOTE — Progress Notes (Signed)
 Patient is a 49 year old male who recently underwent excision of right face mass with Dr. Montorfano on 09/26/2024. During the procedure, the 3 cm wound was closed with 3-0 Monocryl and 4-0 nylon. Patient is about 3 weeks out from his procedure. He presents to the clinic today for postprocedural follow-up.   Patient was last seen in the clinic on 10/13/2024.  At this visit, patient was doing okay.  On exam, there was an area of erythema and swelling noted to the right chin.  Skin does appear to be somewhat dry, but incision did appear to be intact.  There was no tenderness to palpation.  There was no fluctuance or crepitus noted on exam.  No active drainage on exam.  Bactrim  was sent to patient's pharmacy and it was recommended that patient apply Vaseline over the area 1-2 times daily.  Today, patient reports he is doing well.  He states that he has been taking the antibiotics.  He states that he has been applying Vaseline over the area, reports that is still little bit dry/itchy.  Denies any fevers or chills.  Denies any other issues or concerns.  On exam, patient is sitting upright in no acute distress.  Area to the right chin appears to be improved from the previous exam.  There does appear to be some dry skin/irritation.  There is no fluctuance or crepitus noted on exam.  Swelling appears to be improved.  No drainage on exam.  Discussed with the patient I like him to continue to take the antibiotics and monitor the area.  Discussed with him he may continue to apply Vaseline over the area, he may also try a gentle moisturizer.  I discussed with him he can apply hydrocortisone cream over the area if the itchiness/dryness does not improve, but hydrocortisone cream may inhibit wound healing.  He expressed understanding.  I would like the patient to follow back up in about a week.  I instructed him to call if he has any questions or concerns about anything.  Pictures were obtained of the patient and  placed in the chart with the patient's or guardian's permission.

## 2024-10-19 ENCOUNTER — Encounter: Admitting: Physician Assistant

## 2024-10-31 ENCOUNTER — Ambulatory Visit: Admitting: Student

## 2024-10-31 NOTE — Progress Notes (Deleted)
 Patient is a 49 year old male who recently underwent excision of right face mass with Dr. Montorfano on 09/26/2024. During the procedure, the 3 cm wound was closed with 3-0 Monocryl and 4-0 nylon.  Patient is about 5 weeks out from his procedure.  He presents to the clinic today for postprocedural follow-up.     Patient was last seen in the clinic on 10/18/2024.  At this visit, patient was doing well.  On exam, area to the right chin appeared to have improved from the previous exam.  There was still some somewhat dry/irritated skin.  There is no fluctuance or crepitus noted on exam.  Swelling appear to be improved.  It was recommended that patient continue to take the antibiotics and continue with applying Vaseline over the area.  Today,

## 2025-01-11 ENCOUNTER — Ambulatory Visit
